# Patient Record
Sex: Female | Born: 1985 | Race: Black or African American | Hispanic: No | Marital: Single | Smoking: Current every day smoker
Health system: Southern US, Community
[De-identification: ages and names within clinical notes are randomized; demographics above are authoritative.]

## PROBLEM LIST (undated history)

## (undated) ENCOUNTER — Inpatient Hospital Stay (HOSPITAL_COMMUNITY): Payer: Self-pay

## (undated) HISTORY — PX: MANDIBLE FRACTURE SURGERY: SHX706

---
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
## 2006-10-16 ENCOUNTER — Emergency Department (HOSPITAL_COMMUNITY): Admission: EM | Admit: 2006-10-16 | Discharge: 2006-10-16 | Payer: Self-pay | Admitting: Emergency Medicine

## 2007-03-06 ENCOUNTER — Emergency Department (HOSPITAL_COMMUNITY): Admission: EM | Admit: 2007-03-06 | Discharge: 2007-03-06 | Payer: Self-pay | Admitting: *Deleted

## 2007-03-07 ENCOUNTER — Inpatient Hospital Stay (HOSPITAL_COMMUNITY): Admission: AD | Admit: 2007-03-07 | Discharge: 2007-03-07 | Payer: Self-pay | Admitting: Family Medicine

## 2007-06-18 ENCOUNTER — Inpatient Hospital Stay (HOSPITAL_COMMUNITY): Admission: AD | Admit: 2007-06-18 | Discharge: 2007-06-18 | Payer: Self-pay | Admitting: Obstetrics & Gynecology

## 2007-07-23 ENCOUNTER — Inpatient Hospital Stay (HOSPITAL_COMMUNITY): Admission: AD | Admit: 2007-07-23 | Discharge: 2007-07-23 | Payer: Self-pay | Admitting: Family Medicine

## 2008-02-25 ENCOUNTER — Ambulatory Visit: Payer: Self-pay | Admitting: Obstetrics & Gynecology

## 2008-02-25 ENCOUNTER — Inpatient Hospital Stay (HOSPITAL_COMMUNITY): Admission: AD | Admit: 2008-02-25 | Discharge: 2008-02-25 | Payer: Self-pay | Admitting: Obstetrics & Gynecology

## 2009-06-26 ENCOUNTER — Inpatient Hospital Stay (HOSPITAL_COMMUNITY): Admission: AD | Admit: 2009-06-26 | Discharge: 2009-06-26 | Payer: Self-pay | Admitting: Obstetrics & Gynecology

## 2009-08-15 ENCOUNTER — Inpatient Hospital Stay (HOSPITAL_COMMUNITY): Admission: AD | Admit: 2009-08-15 | Discharge: 2009-08-15 | Payer: Self-pay | Admitting: Family Medicine

## 2009-08-15 ENCOUNTER — Ambulatory Visit: Payer: Self-pay | Admitting: Gynecology

## 2009-08-15 ENCOUNTER — Ambulatory Visit: Payer: Self-pay | Admitting: Family Medicine

## 2010-04-28 ENCOUNTER — Inpatient Hospital Stay (HOSPITAL_COMMUNITY)
Admission: AD | Admit: 2010-04-28 | Discharge: 2010-05-01 | DRG: 775 | Disposition: A | Discharge status: Home / Self Care | Payer: Medicaid Other | Source: Ambulatory Visit | Attending: Obstetrics and Gynecology | Admitting: Obstetrics and Gynecology

## 2010-04-28 LAB — CBC
HCT: 36.6 % (ref 36.0–46.0)
Hemoglobin: 12.4 g/dL (ref 12.0–15.0)
MCH: 33 pg (ref 26.0–34.0)
MCHC: 33.9 g/dL (ref 30.0–36.0)
MCV: 97.3 fL (ref 78.0–100.0)
Platelets: 197 10*3/uL (ref 150–400)
RBC: 3.76 MIL/uL — ABNORMAL LOW (ref 3.87–5.11)
RDW: 12.9 % (ref 11.5–15.5)
WBC: 12.2 10*3/uL — ABNORMAL HIGH (ref 4.0–10.5)

## 2010-04-29 LAB — CBC
HCT: 35.9 % — ABNORMAL LOW (ref 36.0–46.0)
Hemoglobin: 12.3 g/dL (ref 12.0–15.0)
RDW: 12.9 % (ref 11.5–15.5)
WBC: 13.5 10*3/uL — ABNORMAL HIGH (ref 4.0–10.5)

## 2010-04-30 LAB — CBC
HCT: 34.4 % — ABNORMAL LOW (ref 36.0–46.0)
MCH: 32.8 pg (ref 26.0–34.0)
MCV: 98 fL (ref 78.0–100.0)
Platelets: 171 10*3/uL (ref 150–400)
RBC: 3.51 MIL/uL — ABNORMAL LOW (ref 3.87–5.11)
RDW: 13 % (ref 11.5–15.5)
WBC: 12.9 10*3/uL — ABNORMAL HIGH (ref 4.0–10.5)

## 2010-05-17 LAB — URINALYSIS, ROUTINE W REFLEX MICROSCOPIC
Nitrite: NEGATIVE
Protein, ur: NEGATIVE mg/dL
Specific Gravity, Urine: 1.02 (ref 1.005–1.030)

## 2010-05-17 LAB — CBC
HCT: 39.4 % (ref 36.0–46.0)
RBC: 4.07 MIL/uL (ref 3.87–5.11)

## 2010-05-17 LAB — GC/CHLAMYDIA PROBE AMP, GENITAL: GC Probe Amp, Genital: NEGATIVE

## 2010-05-19 LAB — URINALYSIS, ROUTINE W REFLEX MICROSCOPIC
Bilirubin Urine: NEGATIVE
Hgb urine dipstick: NEGATIVE
Protein, ur: NEGATIVE mg/dL
Specific Gravity, Urine: 1.02 (ref 1.005–1.030)
Urobilinogen, UA: 0.2 mg/dL (ref 0.0–1.0)
pH: 7.5 (ref 5.0–8.0)

## 2010-05-19 LAB — POCT PREGNANCY, URINE: Preg Test, Ur: NEGATIVE

## 2010-05-19 LAB — WET PREP, GENITAL: Yeast Wet Prep HPF POC: NONE SEEN

## 2010-07-17 NOTE — H&P (Signed)
NAME:  JAELANI, POSA NO.:  [id number]   MEDICAL RECORD NO.:  [id number]          PATIENT TYPE:  MAT   LOCATION:  [facility]                          FACILITY:  [facility]   PHYSICIAN:  Crist Fat. Rivard, M.D. DATE OF BIRTH:  October 08, 1985   DATE OF ADMISSION:  10/13/2004  DATE OF DISCHARGE:                                HISTORY & PHYSICAL   HISTORY OF PRESENT ILLNESS:  Miss Gwynn is an 25 year old, gravida 1, para  0, at 39-4/7 weeks who presented with spontaneous rupture of membranes  approximately 5:30 a.m. with clear fluid noted and uterine contractions  every two to four minutes since that time.  She has been 2 cm in the office.  Pregnancy has been remarkable for:  1.  Gonorrhea x1 and Chlamydia x2 during this pregnancy.  Negative cultures      were obtained during the third trimester.  2.  Positive group B strep.  3.  Adolescent.  4.  History of HSV1.   PRENATAL LABS:  Blood type is O positive.  RH antibody negative.  VDRL  nonreactive.  Rubella titer positive.  Hepatitis B surface antigen negative.  HIV nonreactive.  GC positive in January.  Chlamydia was negative in  January.  She had negative gonorrhea in February and positive Chlamydia.  In  March, she had negative GC and Chlamydia.  Then in May she had positive  Chlamydia and negative HIV, RPR, and HBSAG.  Cystic fibrosis testing was  negative.  Pap smear was normal except for yeast.  Quadruple screen was  normal.  Glucola was normal at 79.  Fetal fibronectin was done at 27 weeks  and was negative.  RPR, HBSAG and HIV were done at 28 weeks which were also  negative.  Group B strep culture was positive in July.  GC and Chlamydia  cultures were negative in the third trimester.  EDC of October 16, 2004 was  established by nine-week ultrasound secondary to questionable LMP.  She did  have history of ASCUS on Pap smear in May 2005 and had no followup.   HISTORY OF PRESENT PREGNANCY:  The patient has been under our care  for  approximately 12 weeks.  She had had an ultrasound at nine weeks for dating  purposes.  She had a test of cure done at her first visit for positive  gonorrhea.  This was still positive. This was positive for Chlamydia.  She  was then treated.  Test of cure done the following visit was negative.  She  had another ultrasound at 22 weeks for size greater than dates.  Normal  anatomy was noted and normal growth.  She had normal Glucola. She had some  spotting at 27 weeks.  Fetal fibronectin was negative. She did have positive  Chlamydia at that time.  Group B strep culture was negative at that point.  However, at 36 weeks it was positive.  She had a normal Glucola.  She had  removal of a small sebaceous cyst on the right buttock.  This drained  spontaneously.  She had a bump noted on her  left thigh between the buttocks  and left thigh consistent with a keloid versus a condylomata.  The rest of  her pregnancy was uncomplicated.   OB HISTORY:  The patient is a primigravida.   PAST MEDICAL HISTORY:  1.  History of gonorrhea noted in January prior to the initiation of her      care.  Subsequently she had Chlamydia noted twice during this pregnancy.  2.  She reports the usual childhood illnesses.   ALLERGIES:  She has no known medication allergies.   FAMILY HISTORY:  Her maternal grandmother had a stroke.  Her paternal  grandmother had some type of cancer and her mother is a smoker.  Genetic  history is remarkable for the patient's brother and sister being twins.   SOCIAL HISTORY:  The patient is single.  Father of the baby is not currently  involved.  The patient is in the 11th grade.  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  She denies any alcohol, drug or tobacco use during this  pregnancy.  She has been followed by the physicians' service.   PHYSICAL EXAMINATION:  VITAL SIGNS:  Blood pressure initially was 148/88,  145/88.  Repeat was now 149/79.  Other vital signs were  stable.  HEENT:  Within normal limits.  LUNGS:  Breath sounds are clear.  HEART:  Regular rate and rhythm without murmur.  BREASTS:  Soft and nontender.  ABDOMEN:  Fundal height approximately 39 cm.  Estimated fetal weight is 7 to  7-1/2 pounds.  Uterine contractions every two to four minutes.  Mild to  moderate quality.  SPECULUM EXAMINATION:  Sterile speculum exam shows positive pooling,  positive Nitrazine, positive fern.  Cervix was 4-5, 80%, vertex at -1 to 0  station.  The patient is noted to be leaking clear fluid.  Fetal heart rate  is reassuring with no decelerations although it is nonreactive.  She did  have a negative spontaneous CST on arrival.  EXTREMITIES:  Deep tendon reflexes are 2+ without clonus.  There is a trace  edema noted.   IMPRESSION:  1.  Intrauterine pregnancy at 39-4/7 weeks.  2.  Early active labor.  3.  Spontaneous rupture of membranes.  4.  Positive group B strep.   PLAN:  1.  Admit to birthing suite per consult with Dr. Silverio Lay as the      attending physician.  2.  Routine physician orders.  3.  Plan Penicillin G 5,000,000 units IV, then 2,500,000 units IV q.4h.  4.  Epidural p.r.n.      Renaldo Reel Emilee Hero, C.N.M.      Crist Fat Rivard, M.D.  Electronically Signed    VLL/MEDQ  D:  10/13/2004  T:  10/13/2004  Job:  [id number]

## 2010-11-19 LAB — POCT PREGNANCY, URINE
Operator id: 146091
Preg Test, Ur: NEGATIVE

## 2010-11-19 LAB — DIFFERENTIAL
Basophils Absolute: 0
Eosinophils Relative: 1
Lymphocytes Relative: 13
Lymphs Abs: 1.3
Monocytes Relative: 5
Neutro Abs: 8.5 — ABNORMAL HIGH

## 2010-11-19 LAB — CBC
Hemoglobin: 14
MCHC: 34.1
MCV: 94.5
Platelets: 233
RBC: 4.34
RDW: 12.6
WBC: 10.4

## 2010-11-19 LAB — URINALYSIS, ROUTINE W REFLEX MICROSCOPIC: Nitrite: NEGATIVE

## 2010-11-19 LAB — WET PREP, GENITAL: Yeast Wet Prep HPF POC: NONE SEEN

## 2010-11-19 LAB — RPR: RPR Ser Ql: NONREACTIVE

## 2010-11-19 LAB — URINE MICROSCOPIC-ADD ON

## 2010-11-24 LAB — POCT PREGNANCY, URINE
Operator id: 11741
Preg Test, Ur: POSITIVE

## 2010-11-24 LAB — URINALYSIS, ROUTINE W REFLEX MICROSCOPIC
Bilirubin Urine: NEGATIVE
Glucose, UA: NEGATIVE
Protein, ur: NEGATIVE
Specific Gravity, Urine: 1.015
Urobilinogen, UA: 0.2

## 2010-11-25 LAB — URINALYSIS, ROUTINE W REFLEX MICROSCOPIC
Bilirubin Urine: NEGATIVE
Ketones, ur: NEGATIVE
Nitrite: NEGATIVE
Urobilinogen, UA: 0.2

## 2010-11-25 LAB — URINE MICROSCOPIC-ADD ON

## 2010-12-04 LAB — WET PREP, GENITAL: Clue Cells Wet Prep HPF POC: NONE SEEN

## 2010-12-04 LAB — URINALYSIS, ROUTINE W REFLEX MICROSCOPIC
Glucose, UA: NEGATIVE mg/dL
Hgb urine dipstick: NEGATIVE
Specific Gravity, Urine: 1.02 (ref 1.005–1.030)
Urobilinogen, UA: 0.2 mg/dL (ref 0.0–1.0)

## 2010-12-04 LAB — GC/CHLAMYDIA PROBE AMP, GENITAL
Chlamydia, DNA Probe: NEGATIVE
GC Probe Amp, Genital: NEGATIVE

## 2011-04-23 ENCOUNTER — Emergency Department (HOSPITAL_COMMUNITY)
Admission: EM | Admit: 2011-04-23 | Discharge: 2011-04-23 | Disposition: A | Discharge status: Home / Self Care | Payer: Medicaid Other | Attending: Emergency Medicine | Admitting: Emergency Medicine

## 2011-04-23 ENCOUNTER — Emergency Department (HOSPITAL_COMMUNITY): Payer: Medicaid Other

## 2011-04-23 ENCOUNTER — Encounter (HOSPITAL_COMMUNITY): Payer: Self-pay | Admitting: *Deleted

## 2011-04-23 DIAGNOSIS — T7411XA Adult physical abuse, confirmed, initial encounter: Secondary | ICD-10-CM | POA: Insufficient documentation

## 2011-04-23 DIAGNOSIS — R259 Unspecified abnormal involuntary movements: Secondary | ICD-10-CM | POA: Insufficient documentation

## 2011-04-23 DIAGNOSIS — S02610A Fracture of condylar process of mandible, unspecified side, initial encounter for closed fracture: Secondary | ICD-10-CM | POA: Insufficient documentation

## 2011-04-23 DIAGNOSIS — T7491XA Unspecified adult maltreatment, confirmed, initial encounter: Secondary | ICD-10-CM | POA: Insufficient documentation

## 2011-04-23 DIAGNOSIS — T7492XA Unspecified child maltreatment, confirmed, initial encounter: Secondary | ICD-10-CM | POA: Insufficient documentation

## 2011-04-23 DIAGNOSIS — IMO0002 Reserved for concepts with insufficient information to code with codable children: Secondary | ICD-10-CM | POA: Insufficient documentation

## 2011-04-23 MED ORDER — AMOXICILLIN-POT CLAVULANATE 875-125 MG PO TABS: 1.0000 | ORAL_TABLET | Freq: Two times a day (BID) | ORAL | Status: AC

## 2011-04-23 MED ORDER — OXYCODONE-ACETAMINOPHEN 5-325 MG PO TABS
1.0000 | ORAL_TABLET | Freq: Once | ORAL | Status: AC
  Administered 2011-04-23: 1 via ORAL
  Filled 2011-04-23: qty 1

## 2011-04-23 MED ORDER — OXYCODONE-ACETAMINOPHEN 5-325 MG PO TABS: 1.0000 | ORAL_TABLET | ORAL | Status: AC | PRN

## 2011-04-23 MED ORDER — MORPHINE SULFATE 4 MG/ML IJ SOLN: 4.0000 mg | INTRAMUSCULAR | Status: DC
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
## 2011-04-23 NOTE — ED Notes (Signed)
Patient transported to X-ray 
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
## 2011-04-23 NOTE — ED Notes (Signed)
Patient transported to CT 
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
## 2011-04-23 NOTE — Discharge Instructions (Signed)
Your xrays showed that you have a broken jaw but did not show signs of other serious injury. We discussed your injury with Dr. Chales Salmon, who is an oral surgeon. Please call his office to set up an appointment as soon as possible. You will need to stick to a soft diet to avoid further injury. Please read the below information for recommended foods. You have been given a prescription for pain medication as well as an antibiotic for the bite on your finger. Return to the ER with worsening pain, trouble swallowing or breathing, or any other worrisome symptoms.  RESOURCE GUIDE  Dental Problems  Patients with Medicaid: [facility] [phone] [address]                                           [phone] [address] Phone:  [phone]                                                  Phone:  [phone]  If unable to pay or uninsured, contact:  [facility] or [facility]. to become qualified for the adult dental clinic.  Chronic Pain Problems Contact [facility]  [phone] Patients need to be referred by their primary care doctor.  Insufficient Money for Medicine Contact [employer] Way:  call "211" or [facility] 365-689-6504.  No Primary Care Doctor Call [facility]  [phone] Other agencies that provide inexpensive medical care    [facility]  [phone]    [facility]  [phone]    [facility]  [phone]    [facility]  [phone]    [facility]  [phone]    [facility]  [phone]  Psychological Services [facility]  [phone] [facility] Services  [phone] [facility]   [phone] (emergency services [phone])  Substance Abuse Resources [facility]  443-308-5698 [facility] [phone] [facility] [phone] Floydene Flock [phone] Residential & Outpatient Substance Abuse  Program  [phone]  Abuse/Neglect [facility] Child Abuse Hotline 204-408-9179 [facility] Child Abuse Hotline 770-370-3934 (After Hours)  Emergency Shelter [facility] [phone]  Maternity Homes [facility] [place] [facility] [phone] [facility] [phone]  MRSA Hotline #:   813-441-4802    [facility] Resources  [facility]     [employer]                          [facility] Dept. [address] [place]                       [address]      [address]  [address]  Cristobal Goldmann Phone:  [phone]                                   Phone:  [phone]                 Phone:  [phone]  [facility] Phone:  [phone]  [facility] Child Abuse Hotline (681) 489-5362 562-621-3846 (After Hours)  Fractured Jaw Diet This diet should be used after jaw or mouth surgery, wired jaw surgery, or dental surgery. The consistency of foods in this diet should be thin enough to be sipped from a straw or given through a syringe. It is important to consume enough calories and protein to prevent weight loss and to promote healing after surgery. You will need to have 3 meals and 3 snacks daily. It is important to eat from a variety of food groups. Foods you normally eat may be blended to the correct texture. INSTRUCTIONS FOR BLENDING FOODS  Prepare foods by removing skins, seeds, and peels.   Cook meats and vegetables thoroughly. Avoid using raw eggs. Powdered or pasteurized egg mixtures may be used.   Cut foods into small pieces and mix with a small amount of liquid in a food processor or blender. Continue to add liquids until foods become thin enough to sip through a straw.   Add juice, milk, cream, broth, gravy, or vegetable juice to add flavor and to thin foods.   Blending foods  sometimes causes air bubbles that may not be desirable. Heating foods after blending will reduce the foam produced from blending.  TIPS  If you are losing weight, you may need to add extra calories to your food by:   Adding powdered milk or protein powder to food.   Adding extra fats, such as tub margarine, sour cream, cream cheese, cream, nut butters (such as peanut butter or almond butter), and half-and-half.   Adding sweets, such as honey, ice cream, black strap molasses, or sugar.   Your teeth and mouth may be sensitive to extreme temperatures. Heat or cool your foods only to lukewarm or cool temperatures.   Stock Water quality scientist with a variety of liquid nutritional supplement drinks.   Take a liquid multivitamin to ensure you are getting adequate nutrients.   Use baby food if you are short on time or energy.  FOOD CHOICES ALL FOODS MUST BE BLENDED. Starches 4 servings or more  Hot cereals, such as oatmeal, grits, ground wheat cereals, and polenta.   Mashed potatoes.  Vegetables 3 servings or more  All vegetables and vegetable juices.  Fruit 2 servings or more  All fruits and fruit juices.  Meat and Meat Substitutes 2 servings or more (6 oz per day)  Soft-boiled eggs, scrambled eggs, cottage cheese, cheese sauce.   Ground meats, such as hamburger, [place], sausage, meatloaf.   Custard, baby food meat.  Milk 2 cups or equivalent  Liquid, powdered, or evaporated milk.   Buttermilk or chocolate milk.   Drinkable yogurt.   Sherbert, ice cream, milkshakes, eggnog, pudding, and liquid supplements.  Beverages  Coffee (regular or decaffeinated), tea, and mineral water.   Liquid supplements.  Condiments  All seasonings and condiments that blend well.  Document Released: 08/05/2009 Document Revised: 10/28/2010 Document Reviewed:  08/05/2009 Exit[employer] Patient Information 2012 [place], [place].Mandibular Fracture You have a fracture of your mandible. This fracture is a  break in the jaw bone. This is usually caused by trauma (a blow to the jaw). HOME CARE INSTRUCTIONS   If your jaws are wired, learn how to release them quickly in case of vomiting or severe coughing. Keep the wire cutters in an easy to reach location or carry them with you.   Apply ice to the injury for 15 to 20 minutes each hour while awake for the first 2 days. Put the ice in a plastic bag and place a towel between the bag of ice and your skin.   Eat a well balanced high-protein (full) liquid diet while your jaw is wired. Your caregiver or dietician can help you with this.   Protect your jaw from pressure. Sleep on your back.   Avoid exercising to the point that you become short of breath.   Only take over-the-counter or prescription medicines for pain, discomfort, or fever as directed by your caregiver.  SEEK MEDICAL CARE IF:   You develop a severe headache or numbness in your face.   You have severe jaw pain unrelieved with medications.   The wires or splints become loose.   You have uncontrollable nausea (feeling sick to your stomach) or anxiety.  SEEK IMMEDIATE MEDICAL CARE IF:  You have a fever.   You have difficulty breathing.   You feel like your airway is closing or hear a high-pitched sound when you try and breathe.   Problems have forced you to cut the wires holding your upper and lower teeth together.  MAKE SURE YOU:   Understand these instructions.   Will watch your condition.   Will get help right away if you are not doing well or get worse.  Document Released: 02/15/2005 Document Revised: 10/28/2010 Document Reviewed: 10/06/2007 [facility] Patient Information 2012 [place], [place].

## 2011-04-23 NOTE — ED Provider Notes (Signed)
Medical screening examination/treatment/procedure(s) were conducted as a shared visit with non-physician practitioner(s) and myself.  I personally evaluated the patient during the encounter  Melanny Wire P Jeanita Carneiro, MD 04/23/11 1629 

## 2012-04-28 ENCOUNTER — Emergency Department (HOSPITAL_COMMUNITY)
Admission: EM | Admit: 2012-04-28 | Discharge: 2012-04-29 | Disposition: A | Discharge status: Home / Self Care | Payer: Medicaid Other | Attending: Emergency Medicine | Admitting: Emergency Medicine

## 2012-04-28 ENCOUNTER — Encounter (HOSPITAL_COMMUNITY): Payer: Self-pay | Admitting: *Deleted

## 2012-04-28 DIAGNOSIS — S335XXA Sprain of ligaments of lumbar spine, initial encounter: Secondary | ICD-10-CM | POA: Insufficient documentation

## 2012-04-28 DIAGNOSIS — Y9389 Activity, other specified: Secondary | ICD-10-CM | POA: Insufficient documentation
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
## 2012-04-29 ENCOUNTER — Emergency Department (HOSPITAL_COMMUNITY): Payer: Medicaid Other

## 2012-04-29 MED ORDER — METHOCARBAMOL 500 MG PO TABS: 500.0000 mg | ORAL_TABLET | Freq: Two times a day (BID) | ORAL | Status: DC

## 2012-04-29 MED ORDER — IBUPROFEN 800 MG PO TABS
800.0000 mg | ORAL_TABLET | Freq: Once | ORAL | Status: AC
  Administered 2012-04-29: 800 mg via ORAL
  Filled 2012-04-29: qty 1

## 2012-04-29 MED ORDER — IBUPROFEN 800 MG PO TABS: 800.0000 mg | ORAL_TABLET | Freq: Three times a day (TID) | ORAL | Status: DC

## 2012-04-29 NOTE — ED Notes (Signed)
Pt child given grape juice.

## 2012-04-29 NOTE — ED Provider Notes (Signed)
Medical screening examination/treatment/procedure(s) were performed by non-physician practitioner and as supervising physician I was immediately available for consultation/collaboration.   Gavin Pound. Oletta Lamas, MD 04/29/12 [phone]
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
## 2012-04-29 NOTE — ED Notes (Signed)
Pt transported to XR.  

## 2012-12-09 ENCOUNTER — Encounter (HOSPITAL_COMMUNITY): Payer: Self-pay | Admitting: Emergency Medicine

## 2012-12-09 ENCOUNTER — Emergency Department (HOSPITAL_COMMUNITY): Payer: Medicaid Other

## 2012-12-09 ENCOUNTER — Emergency Department (HOSPITAL_COMMUNITY)
Admission: EM | Admit: 2012-12-09 | Discharge: 2012-12-09 | Disposition: A | Discharge status: Home / Self Care | Payer: Medicaid Other | Attending: Emergency Medicine | Admitting: Emergency Medicine

## 2012-12-09 DIAGNOSIS — S025XXA Fracture of tooth (traumatic), initial encounter for closed fracture: Secondary | ICD-10-CM | POA: Insufficient documentation

## 2012-12-09 DIAGNOSIS — S0993XA Unspecified injury of face, initial encounter: Secondary | ICD-10-CM

## 2012-12-09 DIAGNOSIS — Z9889 Other specified postprocedural states: Secondary | ICD-10-CM | POA: Insufficient documentation

## 2012-12-09 DIAGNOSIS — IMO0002 Reserved for concepts with insufficient information to code with codable children: Secondary | ICD-10-CM | POA: Insufficient documentation

## 2012-12-09 MED ORDER — NAPROXEN 500 MG PO TABS: 500.0000 mg | ORAL_TABLET | Freq: Two times a day (BID) | ORAL | Status: DC

## 2012-12-09 MED ORDER — TRAMADOL HCL 50 MG PO TABS: 50.0000 mg | ORAL_TABLET | Freq: Four times a day (QID) | ORAL | Status: DC | PRN

## 2012-12-09 NOTE — ED Notes (Signed)
Nurse asked if patient wanted to be private (XXX) said yes.

## 2012-12-09 NOTE — ED Notes (Signed)
Attempted to call patient boyfriend per request of patient no answer [phone]
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
## 2012-12-09 NOTE — ED Notes (Signed)
Gave coloring books to children that are at bedside.

## 2012-12-10 NOTE — ED Provider Notes (Signed)
Medical screening examination/treatment/procedure(s) were performed by non-physician practitioner and as supervising physician I was immediately available for consultation/collaboration.  Flint Melter, MD 12/10/12 0040

## 2013-02-15 LAB — OB RESULTS CONSOLE GC/CHLAMYDIA
CHLAMYDIA, DNA PROBE: NEGATIVE
GC PROBE AMP, GENITAL: NEGATIVE

## 2013-03-19 LAB — OB RESULTS CONSOLE RPR
RPR: NONREACTIVE
RPR: REACTIVE

## 2013-03-19 LAB — OB RESULTS CONSOLE HIV ANTIBODY (ROUTINE TESTING): HIV: NONREACTIVE

## 2013-03-19 LAB — OB RESULTS CONSOLE RUBELLA ANTIBODY, IGM: Rubella: IMMUNE

## 2013-03-19 LAB — OB RESULTS CONSOLE ANTIBODY SCREEN: Antibody Screen: NEGATIVE

## 2013-03-19 LAB — OB RESULTS CONSOLE ABO/RH: RH TYPE: POSITIVE

## 2013-03-19 LAB — OB RESULTS CONSOLE HEPATITIS B SURFACE ANTIGEN: Hepatitis B Surface Ag: NEGATIVE

## 2013-08-30 LAB — OB RESULTS CONSOLE GBS: GBS: POSITIVE

## 2013-10-01 ENCOUNTER — Inpatient Hospital Stay (HOSPITAL_COMMUNITY)
Admission: AD | Admit: 2013-10-01 | Discharge: 2013-10-04 | DRG: 775 | Disposition: A | Discharge status: Home / Self Care | Payer: Medicaid Other | Source: Ambulatory Visit | Attending: Obstetrics and Gynecology | Admitting: Obstetrics and Gynecology

## 2013-10-01 ENCOUNTER — Encounter (HOSPITAL_COMMUNITY): Payer: Self-pay | Admitting: *Deleted

## 2013-10-01 ENCOUNTER — Inpatient Hospital Stay (HOSPITAL_COMMUNITY): Payer: Medicaid Other

## 2013-10-01 DIAGNOSIS — O480 Post-term pregnancy: Principal | ICD-10-CM | POA: Diagnosis present

## 2013-10-01 DIAGNOSIS — D649 Anemia, unspecified: Secondary | ICD-10-CM | POA: Diagnosis present

## 2013-10-01 DIAGNOSIS — O9902 Anemia complicating childbirth: Secondary | ICD-10-CM | POA: Diagnosis present

## 2013-10-01 DIAGNOSIS — O99334 Smoking (tobacco) complicating childbirth: Secondary | ICD-10-CM | POA: Diagnosis present

## 2013-10-01 LAB — URINALYSIS, ROUTINE W REFLEX MICROSCOPIC
Bilirubin Urine: NEGATIVE
Glucose, UA: NEGATIVE mg/dL
KETONES UR: NEGATIVE mg/dL
NITRITE: NEGATIVE
PROTEIN: NEGATIVE mg/dL
Specific Gravity, Urine: 1.015 (ref 1.005–1.030)
Urobilinogen, UA: 1 mg/dL (ref 0.0–1.0)
pH: 7 (ref 5.0–8.0)

## 2013-10-01 LAB — URINE MICROSCOPIC-ADD ON

## 2013-10-01 LAB — CBC
HCT: 30.9 % — ABNORMAL LOW (ref 36.0–46.0)
Hemoglobin: 10.6 g/dL — ABNORMAL LOW (ref 12.0–15.0)
MCH: 33.2 pg (ref 26.0–34.0)
MCHC: 34.3 g/dL (ref 30.0–36.0)
MCV: 96.9 fL (ref 78.0–100.0)
Platelets: 235 10*3/uL (ref 150–400)
RBC: 3.19 MIL/uL — ABNORMAL LOW (ref 3.87–5.11)
RDW: 13.4 % (ref 11.5–15.5)
WBC: 12.4 10*3/uL — ABNORMAL HIGH (ref 4.0–10.5)

## 2013-10-01 MED ORDER — OXYTOCIN 40 UNITS IN LACTATED RINGERS INFUSION - SIMPLE MED
62.5000 mL/h | INTRAVENOUS | Status: DC
  Filled 2013-10-01 (×2): qty 1000

## 2013-10-01 MED ORDER — LACTATED RINGERS IV SOLN
INTRAVENOUS | Status: DC
  Administered 2013-10-01: 18:00:00 via INTRAVENOUS

## 2013-10-01 MED ORDER — PENICILLIN G POTASSIUM 5000000 UNITS IJ SOLR
5.0000 10*6.[IU] | Freq: Once | INTRAVENOUS | Status: AC
  Administered 2013-10-01: 5 10*6.[IU] via INTRAVENOUS
  Filled 2013-10-01: qty 5

## 2013-10-01 MED ORDER — PENICILLIN G POTASSIUM 5000000 UNITS IJ SOLR
2.5000 10*6.[IU] | INTRAMUSCULAR | Status: DC
  Administered 2013-10-02 (×2): 2.5 10*6.[IU] via INTRAVENOUS
  Filled 2013-10-01 (×8): qty 2.5

## 2013-10-01 MED ORDER — TERBUTALINE SULFATE 1 MG/ML IJ SOLN: 0.2500 mg | Freq: Once | INTRAMUSCULAR | Status: AC | PRN

## 2013-10-01 MED ORDER — LIDOCAINE HCL (PF) 1 % IJ SOLN
30.0000 mL | INTRAMUSCULAR | Status: AC | PRN
  Administered 2013-10-02 (×2): 5 mL via SUBCUTANEOUS
  Filled 2013-10-01: qty 30

## 2013-10-01 MED ORDER — ACETAMINOPHEN 325 MG PO TABS: 650.0000 mg | ORAL_TABLET | ORAL | Status: DC | PRN

## 2013-10-01 MED ORDER — BUTORPHANOL TARTRATE 1 MG/ML IJ SOLN
1.0000 mg | INTRAMUSCULAR | Status: DC | PRN
  Administered 2013-10-02 (×2): 1 mg via INTRAVENOUS
  Filled 2013-10-01 (×2): qty 1

## 2013-10-01 MED ORDER — ONDANSETRON HCL 4 MG/2ML IJ SOLN: 4.0000 mg | Freq: Four times a day (QID) | INTRAMUSCULAR | Status: DC | PRN

## 2013-10-01 MED ORDER — OXYTOCIN BOLUS FROM INFUSION
500.0000 mL | INTRAVENOUS | Status: DC
  Administered 2013-10-02: 500 mL via INTRAVENOUS

## 2013-10-01 MED ORDER — IBUPROFEN 600 MG PO TABS
600.0000 mg | ORAL_TABLET | Freq: Four times a day (QID) | ORAL | Status: DC | PRN
  Administered 2013-10-02 – 2013-10-04 (×2): 600 mg via ORAL
  Filled 2013-10-01: qty 1

## 2013-10-01 MED ORDER — FLEET ENEMA 7-19 GM/118ML RE ENEM: 1.0000 | ENEMA | RECTAL | Status: DC | PRN

## 2013-10-01 MED ORDER — OXYCODONE-ACETAMINOPHEN 5-325 MG PO TABS
1.0000 | ORAL_TABLET | ORAL | Status: DC | PRN
  Administered 2013-10-03 (×2): 1 via ORAL
  Filled 2013-10-01: qty 1
  Filled 2013-10-01: qty 2
  Filled 2013-10-01 (×4): qty 1

## 2013-10-01 MED ORDER — LACTATED RINGERS IV SOLN: 500.0000 mL | INTRAVENOUS | Status: DC | PRN

## 2013-10-01 MED ORDER — CITRIC ACID-SODIUM CITRATE 334-500 MG/5ML PO SOLN
30.0000 mL | ORAL | Status: DC | PRN
Start: 2013-10-01 — End: 2013-10-02

## 2013-10-01 MED ORDER — MISOPROSTOL 25 MCG QUARTER TABLET
25.0000 ug | ORAL_TABLET | ORAL | Status: DC | PRN
  Administered 2013-10-01: 25 ug via VAGINAL
  Filled 2013-10-01: qty 0.25
  Filled 2013-10-01: qty 1

## 2013-10-01 NOTE — H&P (Signed)
Pt is a 28 y/o black female [id number] who is admitted for an induction for post term preg with a BPP today of 6/8. PNC was uncomplicated. PMHX: see hollister PE: VSSAF         HEENT-wnl         ABD- gravid, non tender         Cx- 50/1/-2 vtx IMP/ IUP at 40+ wks Plan./Admit for induction.

## 2013-10-01 NOTE — MAU Provider Note (Signed)
  History     CSN: [id number]  Arrival date and time: 10/01/13 1124   First Provider Initiated Contact with Patient 10/01/13 1202      Chief Complaint  Patient presents with  . elevated blood pressure   . Contractions   HPI Comments: [name] 28 y.o. [id number] presents to MAU with elevated BPs at home and contractions. She called the office and was told to come to MAU. She was 1 cm in office last week. She is asking to be induced because the [name] is in jail and she has a lot to get done.      History reviewed. No pertinent past medical history.  Past Surgical History  Procedure Laterality Date  . Mandible fracture surgery      History reviewed. No pertinent family history.  History  Substance Use Topics  . Smoking status: Current Every Day Smoker  . Smokeless tobacco: Never Used  . Alcohol Use: No    Allergies: No Known Allergies  Prescriptions prior to admission  Medication Sig Dispense Refill  . diphenhydramine-acetaminophen (TYLENOL PM) 25-500 MG TABS Take 1 tablet by mouth at bedtime as needed.      . Prenatal Vit-Fe Fumarate-FA (PRENATAL MULTIVITAMIN) TABS tablet Take 1 tablet by mouth daily at 12 noon.        Review of Systems  Constitutional: Negative.   HENT: Negative.   Eyes: Negative.   Respiratory: Negative.   Cardiovascular: Negative.   Gastrointestinal: Negative.   Genitourinary:       Contractions  Musculoskeletal: Negative.   Skin: Negative.   Neurological: Negative.   Psychiatric/Behavioral: Negative.    Physical Exam   Blood pressure 121/63, pulse 88, temperature 98.2 F (36.8 C), temperature source Oral, resp. rate 18, height 5' 3.5" (1.613 m), weight 88.814 kg (195 lb 12.8 oz), last menstrual period 12/07/2012, SpO2 99.00%.  Physical Exam  Constitutional: She is oriented to person, place, and time. She appears well-developed and well-nourished. No distress.  HENT:  Head: Normocephalic and atraumatic.  Eyes: Pupils are equal,  round, and reactive to light.  Cardiovascular: Normal rate, regular rhythm and normal heart sounds.   Respiratory: Effort normal and breath sounds normal.  Genitourinary:  Cervix 1 cm thick  Musculoskeletal: Normal range of motion.  Neurological: She is alert and oriented to person, place, and time.  Skin: Skin is warm.  Psychiatric: She has a normal mood and affect. Her behavior is normal. Judgment and thought content normal.   TOCO: fetal heart rate 130's, reactive but not reassuring, rare contraction MAU Course  Procedures  MDM   Called Dr Dareen PianoAnderson who advised BPP and he would come over and look at strips BPP 6/8 not breathing  Assessment and Plan   A: Labor P: Admit per Dr Bonner PunaAnderson  [name], [name] 10/01/2013, 12:32 PM

## 2013-10-01 NOTE — Progress Notes (Signed)
Dr. Dareen PianoAnderson in department and reviewed EFM tracing. States patient can be discharged pending BPP results. Ordered for patient to be scheduled for induction on Wednesday am; patient placed on induction schedule for Wednesday 10/03/2013 at 6:30 am.

## 2013-10-01 NOTE — Progress Notes (Signed)
Induction orders given: 25mcg cytotec q4h, PCN for +GBS treatment, stadol 1mg  q1h PRN, and pt may have epidural upon maternal request.

## 2013-10-01 NOTE — MAU Note (Signed)
Patient presents to MAU for evaluation of elevated blood pressure; states took BP at home and was 160's over 100's. Called OB office and they advised patient to come to MAU. Reports occassional contractions since yesterday; Last VE in office last week was 1cm. Denies LOF or VB at this time. +FM. States she is very stressed with [name] in jail and desires induction.

## 2013-10-02 ENCOUNTER — Encounter (HOSPITAL_COMMUNITY): Payer: Medicaid Other | Admitting: Anesthesiology

## 2013-10-02 ENCOUNTER — Encounter (HOSPITAL_COMMUNITY): Payer: Self-pay | Admitting: *Deleted

## 2013-10-02 ENCOUNTER — Inpatient Hospital Stay (HOSPITAL_COMMUNITY): Payer: Medicaid Other | Admitting: Anesthesiology

## 2013-10-02 LAB — RPR

## 2013-10-02 MED ORDER — PHENYLEPHRINE 40 MCG/ML (10ML) SYRINGE FOR IV PUSH (FOR BLOOD PRESSURE SUPPORT)
80.0000 ug | PREFILLED_SYRINGE | INTRAVENOUS | Status: DC | PRN
  Filled 2013-10-02: qty 2
  Filled 2013-10-02: qty 10

## 2013-10-02 MED ORDER — PHENYLEPHRINE 40 MCG/ML (10ML) SYRINGE FOR IV PUSH (FOR BLOOD PRESSURE SUPPORT)
80.0000 ug | PREFILLED_SYRINGE | INTRAVENOUS | Status: DC | PRN
  Filled 2013-10-02: qty 2

## 2013-10-02 MED ORDER — METHYLERGONOVINE MALEATE 0.2 MG/ML IJ SOLN: 0.2000 mg | INTRAMUSCULAR | Status: DC | PRN

## 2013-10-02 MED ORDER — ONDANSETRON HCL 4 MG/2ML IJ SOLN: 4.0000 mg | INTRAMUSCULAR | Status: DC | PRN

## 2013-10-02 MED ORDER — LANOLIN HYDROUS EX OINT: TOPICAL_OINTMENT | CUTANEOUS | Status: DC | PRN

## 2013-10-02 MED ORDER — TERBUTALINE SULFATE 1 MG/ML IJ SOLN: 0.2500 mg | Freq: Once | INTRAMUSCULAR | Status: DC | PRN

## 2013-10-02 MED ORDER — PRENATAL MULTIVITAMIN CH
1.0000 | ORAL_TABLET | Freq: Every day | ORAL | Status: DC
  Administered 2013-10-02 – 2013-10-04 (×3): 1 via ORAL
  Filled 2013-10-02 (×3): qty 1

## 2013-10-02 MED ORDER — DIPHENHYDRAMINE HCL 50 MG/ML IJ SOLN: 12.5000 mg | INTRAMUSCULAR | Status: DC | PRN

## 2013-10-02 MED ORDER — EPHEDRINE 5 MG/ML INJ
10.0000 mg | INTRAVENOUS | Status: DC | PRN
Start: 2013-10-02 — End: 2013-10-02
  Filled 2013-10-02: qty 2

## 2013-10-02 MED ORDER — DIBUCAINE 1 % RE OINT: 1.0000 "application " | TOPICAL_OINTMENT | RECTAL | Status: DC | PRN

## 2013-10-02 MED ORDER — SENNOSIDES-DOCUSATE SODIUM 8.6-50 MG PO TABS
2.0000 | ORAL_TABLET | ORAL | Status: DC
  Administered 2013-10-03 – 2013-10-04 (×2): 2 via ORAL
  Filled 2013-10-02 (×2): qty 2

## 2013-10-02 MED ORDER — TETANUS-DIPHTH-ACELL PERTUSSIS 5-2.5-18.5 LF-MCG/0.5 IM SUSP
0.5000 mL | Freq: Once | INTRAMUSCULAR | Status: AC
  Administered 2013-10-03: 0.5 mL via INTRAMUSCULAR

## 2013-10-02 MED ORDER — DIPHENHYDRAMINE HCL 25 MG PO CAPS
25.0000 mg | ORAL_CAPSULE | Freq: Four times a day (QID) | ORAL | Status: DC | PRN
Start: 2013-10-02 — End: 2013-10-02

## 2013-10-02 MED ORDER — OXYCODONE-ACETAMINOPHEN 5-325 MG PO TABS
1.0000 | ORAL_TABLET | ORAL | Status: DC | PRN
  Administered 2013-10-02: 2 via ORAL
  Administered 2013-10-02 – 2013-10-04 (×4): 1 via ORAL
  Filled 2013-10-02: qty 1
  Filled 2013-10-02: qty 2

## 2013-10-02 MED ORDER — LACTATED RINGERS IV SOLN
500.0000 mL | Freq: Once | INTRAVENOUS | Status: AC
  Administered 2013-10-02: 500 mL via INTRAVENOUS

## 2013-10-02 MED ORDER — PNEUMOCOCCAL VAC POLYVALENT 25 MCG/0.5ML IJ INJ
0.5000 mL | INJECTION | INTRAMUSCULAR | Status: AC
  Administered 2013-10-03: 0.5 mL via INTRAMUSCULAR
  Filled 2013-10-02 (×2): qty 0.5

## 2013-10-02 MED ORDER — ONDANSETRON HCL 4 MG PO TABS: 4.0000 mg | ORAL_TABLET | ORAL | Status: DC | PRN

## 2013-10-02 MED ORDER — OXYTOCIN 40 UNITS IN LACTATED RINGERS INFUSION - SIMPLE MED
1.0000 m[IU]/min | INTRAVENOUS | Status: DC
  Administered 2013-10-02: 2 m[IU]/min via INTRAVENOUS

## 2013-10-02 MED ORDER — ZOLPIDEM TARTRATE 5 MG PO TABS: 5.0000 mg | ORAL_TABLET | Freq: Every evening | ORAL | Status: DC | PRN

## 2013-10-02 MED ORDER — EPHEDRINE 5 MG/ML INJ
10.0000 mg | INTRAVENOUS | Status: DC | PRN
  Filled 2013-10-02: qty 2

## 2013-10-02 MED ORDER — SIMETHICONE 80 MG PO CHEW: 80.0000 mg | CHEWABLE_TABLET | ORAL | Status: DC | PRN

## 2013-10-02 MED ORDER — METHYLERGONOVINE MALEATE 0.2 MG PO TABS: 0.2000 mg | ORAL_TABLET | ORAL | Status: DC | PRN

## 2013-10-02 MED ORDER — BENZOCAINE-MENTHOL 20-0.5 % EX AERO
1.0000 "application " | INHALATION_SPRAY | CUTANEOUS | Status: DC | PRN
  Administered 2013-10-02: 1 via TOPICAL
  Filled 2013-10-02: qty 56

## 2013-10-02 MED ORDER — FENTANYL 2.5 MCG/ML BUPIVACAINE 1/10 % EPIDURAL INFUSION (WH - ANES)
14.0000 mL/h | INTRAMUSCULAR | Status: DC | PRN
  Administered 2013-10-02: 14 mL/h via EPIDURAL
  Filled 2013-10-02: qty 125

## 2013-10-02 MED ORDER — WITCH HAZEL-GLYCERIN EX PADS: 1.0000 "application " | MEDICATED_PAD | CUTANEOUS | Status: DC | PRN

## 2013-10-02 MED ORDER — IBUPROFEN 600 MG PO TABS
600.0000 mg | ORAL_TABLET | Freq: Four times a day (QID) | ORAL | Status: DC
  Administered 2013-10-02 – 2013-10-04 (×7): 600 mg via ORAL
  Filled 2013-10-02 (×8): qty 1

## 2013-10-02 NOTE — Anesthesia Preprocedure Evaluation (Signed)
Anesthesia Evaluation  Patient identified by MRN, date of birth, ID band Patient awake    Reviewed: Allergy & Precautions, H&P , NPO status , Patient's Chart, lab work & pertinent test results  History of Anesthesia Complications Negative for: history of anesthetic complications  Airway Mallampati: II TM Distance: >3 FB Neck ROM: Full    Dental   Pulmonary neg shortness of breath, neg sleep apnea, neg COPDneg recent URI, Current Smoker,          Cardiovascular negative cardio ROS  Rhythm:Regular     Neuro/Psych negative neurological ROS  negative psych ROS   GI/Hepatic negative GI ROS, Neg liver ROS,   Endo/Other  negative endocrine ROS  Renal/GU negative Renal ROS     Musculoskeletal   Abdominal   Peds  Hematology  (+) anemia ,   Anesthesia Other Findings   Reproductive/Obstetrics                           Anesthesia Physical Anesthesia Plan  ASA: II  Anesthesia Plan: Epidural   Post-op Pain Management:    Induction:   Airway Management Planned:   Additional Equipment:   Intra-op Plan:   Post-operative Plan:   Informed Consent: I have reviewed the patients History and Physical, chart, labs and discussed the procedure including the risks, benefits and alternatives for the proposed anesthesia with the patient or authorized representative who has indicated his/her understanding and acceptance.   Dental advisory given  Plan Discussed with: Anesthesiologist  Anesthesia Plan Comments:         Anesthesia Quick Evaluation

## 2013-10-02 NOTE — Lactation Note (Signed)
This note was copied from the chart of [name]. Lactation Consultation Note  Patient Name: Hayley Pablo Lawrencelona Liskey [id number]'s Date: 10/02/2013 Reason for consult: Other (Comment) (charting for exclusion)   Maternal Data Formula Feeding for Exclusion: Yes Reason for exclusion: Mother's choice to formula feed on admision  Feeding    LATCH Score/Interventions                      Lactation Tools Discussed/Used     Consult Status Consult Status: Complete    [name], [name] 10/02/2013, 4:15 PM

## 2013-10-02 NOTE — Anesthesia Postprocedure Evaluation (Signed)
Anesthesia Post Note  Patient: [name]  Procedure(s) Performed: * No procedures listed *  Anesthesia type: Epidural  Patient location: Mother/Baby  Post pain: Pain level controlled  Post assessment: Post-op Vital signs reviewed  Last Vitals:  Filed Vitals:   10/02/13 1344  BP: 127/66  Pulse: 55  Temp: 36.8 C  Resp: 18    Post vital signs: Reviewed  Level of consciousness:alert  Complications: No apparent anesthesia complications

## 2013-10-02 NOTE — Progress Notes (Signed)
UR chart review completed.  

## 2013-10-02 NOTE — Anesthesia Procedure Notes (Signed)
Epidural Patient location during procedure: OB Start time: 10/02/2013 4:39 AM End time: 10/02/2013 4:51 AM  Staffing Anesthesiologist: Nabiha Planck, CHRIS Performed by: anesthesiologist   Preanesthetic Checklist Completed: patient identified, surgical consent, pre-op evaluation, timeout performed, IV checked, risks and benefits discussed and monitors and equipment checked  Epidural Patient position: sitting Prep: site prepped and draped and DuraPrep Patient monitoring: heart rate, cardiac monitor, continuous pulse ox and blood pressure Approach: midline Location: L3-L4 Injection technique: LOR saline  Needle:  Needle type: Tuohy  Needle gauge: 17 G Needle length: 9 cm Needle insertion depth: 6 cm Catheter type: closed end flexible Catheter size: 19 Gauge Catheter at skin depth: 12 cm Test dose: Other  Assessment Events: blood not aspirated, injection not painful, no injection resistance, negative IV test and no paresthesia  Additional Notes H+P and labs checked, risks and benefits discussed with the patient, consent obtained, procedure tolerated well and without complications.  Reason for block:procedure for pain

## 2013-10-03 ENCOUNTER — Inpatient Hospital Stay (HOSPITAL_COMMUNITY): Admit: 2013-10-03 | Payer: Medicaid Other

## 2013-10-03 LAB — CBC
HEMATOCRIT: 29.4 % — AB (ref 36.0–46.0)
HEMOGLOBIN: 9.9 g/dL — AB (ref 12.0–15.0)
MCH: 32.7 pg (ref 26.0–34.0)
MCHC: 33.7 g/dL (ref 30.0–36.0)
MCV: 97 fL (ref 78.0–100.0)
Platelets: 213 10*3/uL (ref 150–400)
RBC: 3.03 MIL/uL — ABNORMAL LOW (ref 3.87–5.11)
RDW: 13.4 % (ref 11.5–15.5)
WBC: 13.8 10*3/uL — AB (ref 4.0–10.5)

## 2013-10-03 NOTE — Progress Notes (Signed)
PPD#1 Pt and baby doing well IMP/ stable PLAN/ Routine care.

## 2013-10-04 MED ORDER — OXYCODONE-ACETAMINOPHEN 5-325 MG PO TABS: 1.0000 | ORAL_TABLET | ORAL | Status: DC | PRN

## 2013-10-04 NOTE — Discharge Summary (Signed)
Obstetric Discharge Summary Reason for Admission: induction of labor Prenatal Procedures: ultrasound Intrapartum Procedures: spontaneous vaginal delivery Postpartum Procedures: none Complications-Operative and Postpartum: none Hemoglobin  Date Value Ref Range Status  10/03/2013 9.9* 12.0 - 15.0 g/dL Final     HCT  Date Value Ref Range Status  10/03/2013 29.4* 36.0 - 46.0 % Final    Physical Exam:  General: alert Lochia: appropriate Uterine Fundus: firm   Discharge Diagnoses: Term Pregnancy-delivered  Discharge Information: Date: 10/04/2013 Activity: pelvic rest Diet: routine Medications: PNV, Ibuprofen and Percocet Condition: stable Instructions: refer to practice specific booklet Discharge to: home Follow-up Information   Follow up with [name],Hayley Deskins Burton, [name]. Schedule an appointment as soon as possible for a visit in 1 month.   Specialty:  Obstetrics and Gynecology   Contact information:   [address] [place] [id number] [phone]       Newborn Data: Live born female  Birth Weight: 8 lb 2 oz (3685 g) APGAR: 9, 9  Home with mother.  [name] 10/04/2013, 8:56 AM

## 2013-10-04 NOTE — Progress Notes (Signed)
PPD#2 Pt and baby doing well. IMP/ stable Plan/ will discharge.

## 2013-12-31 ENCOUNTER — Encounter (HOSPITAL_COMMUNITY): Payer: Self-pay | Admitting: *Deleted

## 2014-08-05 ENCOUNTER — Inpatient Hospital Stay (HOSPITAL_COMMUNITY): Payer: Medicaid Other

## 2014-08-05 ENCOUNTER — Inpatient Hospital Stay (HOSPITAL_COMMUNITY)
Admission: AD | Admit: 2014-08-05 | Discharge: 2014-08-05 | Disposition: A | Discharge status: Home / Self Care | Payer: Medicaid Other | Source: Ambulatory Visit | Attending: Emergency Medicine | Admitting: Emergency Medicine

## 2014-08-05 ENCOUNTER — Encounter (HOSPITAL_COMMUNITY): Payer: Self-pay | Admitting: *Deleted

## 2014-08-05 DIAGNOSIS — Z79899 Other long term (current) drug therapy: Secondary | ICD-10-CM | POA: Insufficient documentation

## 2014-08-05 DIAGNOSIS — Z720 Tobacco use: Secondary | ICD-10-CM | POA: Diagnosis not present

## 2014-08-05 DIAGNOSIS — R079 Chest pain, unspecified: Secondary | ICD-10-CM | POA: Diagnosis present

## 2014-08-05 DIAGNOSIS — R059 Cough, unspecified: Secondary | ICD-10-CM

## 2014-08-05 DIAGNOSIS — R110 Nausea: Secondary | ICD-10-CM | POA: Diagnosis not present

## 2014-08-05 DIAGNOSIS — R0602 Shortness of breath: Secondary | ICD-10-CM | POA: Diagnosis not present

## 2014-08-05 DIAGNOSIS — M546 Pain in thoracic spine: Secondary | ICD-10-CM | POA: Diagnosis not present

## 2014-08-05 DIAGNOSIS — Z8781 Personal history of (healed) traumatic fracture: Secondary | ICD-10-CM | POA: Insufficient documentation

## 2014-08-05 DIAGNOSIS — Z3202 Encounter for pregnancy test, result negative: Secondary | ICD-10-CM | POA: Insufficient documentation

## 2014-08-05 DIAGNOSIS — R05 Cough: Secondary | ICD-10-CM | POA: Insufficient documentation

## 2014-08-05 DIAGNOSIS — R0789 Other chest pain: Secondary | ICD-10-CM | POA: Diagnosis not present

## 2014-08-05 LAB — BASIC METABOLIC PANEL
Anion gap: 10 (ref 5–15)
BUN: 9 mg/dL (ref 6–20)
CO2: 21 mmol/L — ABNORMAL LOW (ref 22–32)
Calcium: 9.2 mg/dL (ref 8.9–10.3)
Chloride: 107 mmol/L (ref 101–111)
Creatinine, Ser: 0.85 mg/dL (ref 0.44–1.00)
GFR calc Af Amer: >60 mL/min (ref >60)
GFR calc non Af Amer: >60 mL/min (ref >60)
Glucose, Bld: 144 mg/dL — ABNORMAL HIGH (ref 65–99)
Potassium: 3.1 mmol/L — ABNORMAL LOW (ref 3.5–5.1)
SODIUM: 138 mmol/L (ref 135–145)

## 2014-08-05 LAB — URINALYSIS, ROUTINE W REFLEX MICROSCOPIC
BILIRUBIN URINE: NEGATIVE
GLUCOSE, UA: NEGATIVE mg/dL
KETONES UR: NEGATIVE mg/dL
NITRITE: NEGATIVE
PH: 6 (ref 5.0–8.0)
Protein, ur: NEGATIVE mg/dL
UROBILINOGEN UA: 0.2 mg/dL (ref 0.0–1.0)

## 2014-08-05 LAB — CBC WITH DIFFERENTIAL/PLATELET
Basophils Absolute: 0 10*3/uL (ref 0.0–0.1)
Basophils Relative: 0 % (ref 0–1)
EOS PCT: 2 % (ref 0–5)
Eosinophils Absolute: 0.1 10*3/uL (ref 0.0–0.7)
HEMATOCRIT: 38.7 % (ref 36.0–46.0)
Hemoglobin: 13 g/dL (ref 12.0–15.0)
LYMPHS PCT: 45 % (ref 12–46)
Lymphs Abs: 3.4 10*3/uL (ref 0.7–4.0)
MCH: 30.4 pg (ref 26.0–34.0)
MCHC: 33.6 g/dL (ref 30.0–36.0)
MCV: 90.4 fL (ref 78.0–100.0)
Monocytes Absolute: 0.4 10*3/uL (ref 0.1–1.0)
Monocytes Relative: 5 % (ref 3–12)
Neutro Abs: 3.6 10*3/uL (ref 1.7–7.7)
Neutrophils Relative %: 48 % (ref 43–77)
PLATELETS: 240 10*3/uL (ref 150–400)
RBC: 4.28 MIL/uL (ref 3.87–5.11)
RDW: 12.8 % (ref 11.5–15.5)
WBC: 7.6 10*3/uL (ref 4.0–10.5)

## 2014-08-05 LAB — URINE MICROSCOPIC-ADD ON

## 2014-08-05 LAB — I-STAT TROPONIN, ED: TROPONIN I, POC: 0 ng/mL (ref 0.00–0.08)

## 2014-08-05 LAB — POCT PREGNANCY, URINE: Preg Test, Ur: NEGATIVE

## 2014-08-05 MED ORDER — ALBUTEROL SULFATE HFA 108 (90 BASE) MCG/ACT IN AERS
1.0000 | INHALATION_SPRAY | Freq: Four times a day (QID) | RESPIRATORY_TRACT | Status: DC | PRN
  Administered 2014-08-05: 2 via RESPIRATORY_TRACT
  Filled 2014-08-05: qty 6.7

## 2014-08-05 MED ORDER — CYCLOBENZAPRINE HCL 10 MG PO TABS: 10.0000 mg | ORAL_TABLET | Freq: Two times a day (BID) | ORAL | Status: DC | PRN

## 2014-08-05 MED ORDER — POTASSIUM CHLORIDE CRYS ER 20 MEQ PO TBCR
40.0000 meq | EXTENDED_RELEASE_TABLET | Freq: Once | ORAL | Status: AC
  Administered 2014-08-05: 40 meq via ORAL
  Filled 2014-08-05: qty 2

## 2014-08-05 MED ORDER — HYDROCODONE-ACETAMINOPHEN 5-325 MG PO TABS
2.0000 | ORAL_TABLET | Freq: Once | ORAL | Status: AC
  Administered 2014-08-05: 2 via ORAL
  Filled 2014-08-05: qty 2

## 2014-08-05 MED ORDER — BENZONATATE 100 MG PO CAPS: 100.0000 mg | ORAL_CAPSULE | Freq: Three times a day (TID) | ORAL | Status: DC

## 2014-08-05 MED ORDER — IBUPROFEN 600 MG PO TABS: 600.0000 mg | ORAL_TABLET | Freq: Four times a day (QID) | ORAL | Status: DC | PRN

## 2014-08-05 MED ORDER — IBUPROFEN 800 MG PO TABS
800.0000 mg | ORAL_TABLET | Freq: Once | ORAL | Status: AC
  Administered 2014-08-05: 800 mg via ORAL
  Filled 2014-08-05: qty 1

## 2014-08-05 NOTE — MAU Note (Signed)
Pt transported to [facility] by [facility].

## 2014-08-05 NOTE — MAU Provider Note (Signed)
History     CSN: [id number]  Arrival date and time: 08/05/14 1523   None     Chief Complaint  Patient presents with  . Chest Pain   HPI [name] is 29 y.o. [id number] presents with complaint of left chest pain and shortness of breath that caused to leave work today at 8am.  Began while walking up steps in her mid upper back.  She did not take anything for the pain but drank some water and ate breakfast hoping that would help.  She laid down, hard to get comfortable and was able to sleep until 30 mins ago.  When she woke up the discomfort continued.  She rates the pain at its worse 10/10 and currently at 8/10.  Lying still helps.  She is holding her left upper chest.  Hurts with movement.  Better at rest.  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 She is unsure if she is pregnant.  Using condoms off and on for contraception.  She is a patient of Dr. Ewell [facility] and [facility]. VSS in triage are WNL.     No past medical history on file.  Past Surgical History  Procedure Laterality Date  . Mandible fracture surgery      No family history on file.  History  Substance Use Topics  . Smoking status: Current Every Day Smoker -- 0.25 packs/day    Types: Cigarettes  . Smokeless tobacco: Never Used  . Alcohol Use: No    Allergies: No Known Allergies  Prescriptions prior to admission  Medication Sig Dispense Refill Last Dose  . oxyCODONE-acetaminophen (PERCOCET/ROXICET) 5-325 MG per tablet Take 1-2 tablets by mouth every 3 (three) hours as needed for severe pain (for pain scale > 4). 30 tablet 0   . Prenatal Vit-Fe Fumarate-FA (PRENATAL MULTIVITAMIN) TABS tablet Take 1 tablet by mouth daily at 12 noon.   Past Week at Unknown time    Review of Systems  Constitutional: Negative for fever and chills.  Cardiovascular: Positive for chest pain.  Gastrointestinal: Positive for nausea. Negative for vomiting and abdominal pain.   Genitourinary:       Neg for vaginal bleeding and discharge.   Musculoskeletal: Positive for back pain (upper mid back pain).  Neurological: Negative for headaches.   Physical Exam   Blood pressure 134/81, pulse 75, temperature 98.9 F (37.2 C), temperature source Oral, resp. rate 18, height 5' 4.5" (1.638 m), weight 155 lb (70.308 kg), SpO2 100 %, unknown if currently breastfeeding.  Physical Exam  Constitutional: She is oriented to person, place, and time. She appears well-developed and well-nourished. No distress.  Patient is lying comfortably without observed shortness of breath.  Able to text  HENT:  Head: Normocephalic.  Neck: Normal range of motion.  Cardiovascular: Normal rate, regular rhythm and normal heart sounds.   Respiratory: Effort normal and breath sounds normal. No respiratory distress. She has no wheezes. She has no rales. She exhibits tenderness.  GI: Soft. She exhibits no distension and no mass. There is no tenderness. There is no rebound and no guarding.  Neurological: She is alert and oriented to person, place, and time.  Skin: Skin is warm and dry.  Psychiatric: She has a normal mood and affect. Her behavior is normal.   Results for orders placed or performed during the hospital encounter of 08/05/14 (from the past 24 hour(s))  Pregnancy, urine POC     Status: None  Collection Time: 08/05/14  3:41 PM  Result Value Ref Range   Preg Test, Ur NEGATIVE NEGATIVE    ECG- unconfirmed report- NSR with sinus arrhythmia.  Ventricular rate 81.    MAU Course  Procedures  MDM 16:15  Reported MSE to Dr. Dareen Piano.  Order given to transfer to [facility] for further evaluation of Non OB/ non gyn sxs.   Reported ECG, VSS and plan of care with the patient.  She states she has to pick her children up.  Explained that I need to transfer to [facility] for further evaluation.  She is calling someone to pick them up. 16: 45  Patient agrees to go to [facility].  Her Mother will pick her children  up  16:48  Call to [facility] report given to Dr. Fredderick Phenix, who accepts transfer of patient.  She will go by [employer] [facility] and Plan  A:  Chest pain      Shortness of breath      Negative UPT  P:  Transfer to [facility] for further evaluation of chest pain/SOB  Alline Pio,EVE M 08/05/2014, 3:43 PM

## 2014-08-05 NOTE — Discharge Instructions (Signed)
Upper Respiratory Infection, Adult °An upper respiratory infection (URI) is also sometimes known as the common cold. The upper respiratory tract includes the nose, sinuses, throat, trachea, and bronchi. Bronchi are the airways leading to the lungs. Most people improve within 1 week, but symptoms can last up to 2 weeks. A residual cough may last even longer.  °CAUSES °Many different viruses can infect the tissues lining the upper respiratory tract. The tissues become irritated and inflamed and often become very moist. Mucus production is also common. A cold is contagious. You can easily spread the virus to others by oral contact. This includes kissing, sharing a glass, coughing, or sneezing. Touching your mouth or nose and then touching a surface, which is then touched by another person, can also spread the virus. °SYMPTOMS  °Symptoms typically develop 1 to 3 days after you come in contact with a cold virus. Symptoms vary from person to person. They may include: °· Runny nose. °· Sneezing. °· Nasal congestion. °· Sinus irritation. °· Sore throat. °· Loss of voice (laryngitis). °· Cough. °· Fatigue. °· Muscle aches. °· Loss of appetite. °· Headache. °· Low-grade fever. °DIAGNOSIS  °You might diagnose your own cold based on familiar symptoms, since most people get a cold 2 to 3 times a year. Your caregiver can confirm this based on your exam. Most importantly, your caregiver can check that your symptoms are not due to another disease such as strep throat, sinusitis, pneumonia, asthma, or epiglottitis. Blood tests, throat tests, and X-rays are not necessary to diagnose a common cold, but they may sometimes be helpful in excluding other more serious diseases. Your caregiver will decide if any further tests are required. °RISKS AND COMPLICATIONS  °You may be at risk for a more severe case of the common cold if you smoke cigarettes, have chronic heart disease (such as heart failure) or lung disease (such as asthma), or if  you have a weakened immune system. The very young and very old are also at risk for more serious infections. Bacterial sinusitis, middle ear infections, and bacterial pneumonia can complicate the common cold. The common cold can worsen asthma and chronic obstructive pulmonary disease (COPD). Sometimes, these complications can require emergency medical care and may be life-threatening. °PREVENTION  °The best way to protect against getting a cold is to practice good hygiene. Avoid oral or hand contact with people with cold symptoms. Wash your hands often if contact occurs. There is no clear evidence that vitamin C, vitamin E, echinacea, or exercise reduces the chance of developing a cold. However, it is always recommended to get plenty of rest and practice good nutrition. °TREATMENT  °Treatment is directed at relieving symptoms. There is no cure. Antibiotics are not effective, because the infection is caused by a virus, not by bacteria. Treatment may include: °· Increased fluid intake. Sports drinks offer valuable electrolytes, sugars, and fluids. °· Breathing heated mist or steam (vaporizer or shower). °· Eating chicken soup or other clear broths, and maintaining good nutrition. °· Getting plenty of rest. °· Using gargles or lozenges for comfort. °· Controlling fevers with ibuprofen or acetaminophen as directed by your caregiver. °· Increasing usage of your inhaler if you have asthma. °Zinc gel and zinc lozenges, taken in the first 24 hours of the common cold, can shorten the duration and lessen the severity of symptoms. Pain medicines may help with fever, muscle aches, and throat pain. A variety of non-prescription medicines are available to treat congestion and runny nose. Your caregiver   can make recommendations and may suggest nasal or lung inhalers for other symptoms.  °HOME CARE INSTRUCTIONS  °· Only take over-the-counter or prescription medicines for pain, discomfort, or fever as directed by your  caregiver. °· Use a warm mist humidifier or inhale steam from a shower to increase air moisture. This may keep secretions moist and make it easier to breathe. °· Drink enough water and fluids to keep your urine clear or pale yellow. °· Rest as needed. °· Return to work when your temperature has returned to normal or as your caregiver advises. You may need to stay home longer to avoid infecting others. You can also use a face mask and careful hand washing to prevent spread of the virus. °SEEK MEDICAL CARE IF:  °· After the first few days, you feel you are getting worse rather than better. °· You need your caregiver's advice about medicines to control symptoms. °· You develop chills, worsening shortness of breath, or brown or red sputum. These may be signs of pneumonia. °· You develop yellow or brown nasal discharge or pain in the face, especially when you bend forward. These may be signs of sinusitis. °· You develop a fever, swollen neck glands, pain with swallowing, or white areas in the back of your throat. These may be signs of strep throat. °SEEK IMMEDIATE MEDICAL CARE IF:  °· You have a fever. °· You develop severe or persistent headache, ear pain, sinus pain, or chest pain. °· You develop wheezing, a prolonged cough, cough up blood, or have a change in your usual mucus (if you have chronic lung disease). °· You develop sore muscles or a stiff neck. °Document Released: 08/11/2000 Document Revised: 05/10/2011 Document Reviewed: 05/23/2013 °ExitCare® Patient Information ©2015 [employer]. This information is not intended to replace advice given to you by your health care provider. Make sure you discuss any questions you have with your health care provider. ° ° °Chest Wall Pain °Chest wall pain is pain in or around the bones and muscles of your chest. It may take up to 6 weeks to get better. It may take longer if you must stay physically active in your work and activities.  °CAUSES  °Chest wall pain may happen on  its own. However, it may be caused by: °· A viral illness like the flu. °· Injury. °· Coughing. °· Exercise. °· Arthritis. °· Fibromyalgia. °· Shingles. °HOME CARE INSTRUCTIONS  °· Avoid overtiring physical activity. Try not to strain or perform activities that cause pain. This includes any activities using your chest or your abdominal and side muscles, especially if heavy weights are used. °· Put ice on the sore area. °¨ Put ice in a plastic bag. °¨ Place a towel between your skin and the bag. °¨ Leave the ice on for 15-20 minutes per hour while awake for the first 2 days. °· Only take over-the-counter or prescription medicines for pain, discomfort, or fever as directed by your caregiver. °SEEK IMMEDIATE MEDICAL CARE IF:  °· Your pain increases, or you are very uncomfortable. °· You have a fever. °· Your chest pain becomes worse. °· You have new, unexplained symptoms. °· You have nausea or vomiting. °· You feel sweaty or lightheaded. °· You have a cough with phlegm (sputum), or you cough up blood. °MAKE SURE YOU:  °· Understand these instructions. °· Will watch your condition. °· Will get help right away if you are not doing well or get worse. °Document Released: 02/15/2005 Document Revised: 05/10/2011 Document Reviewed: 10/12/2010 °  ExitCare Patient Information 2015 [place]. This information is not intended to replace advice given to you by your health care provider. Make sure you discuss any questions you have with your health care provider.  Back Pain, Adult Low back pain is very common. About 1 in 5 people have back pain.The cause of low back pain is rarely dangerous. The pain often gets better over time.About half of people with a sudden onset of back pain feel better in just 2 weeks. About 8 in 10 people feel better by 6 weeks.  CAUSES Some common causes of back pain include:  Strain of the muscles or ligaments supporting the spine.  Wear and tear (degeneration) of the spinal  discs.  Arthritis.  Direct injury to the back. DIAGNOSIS Most of the time, the direct cause of low back pain is not known.However, back pain can be treated effectively even when the exact cause of the pain is unknown.Answering your caregiver's questions about your overall health and symptoms is one of the most accurate ways to make sure the cause of your pain is not dangerous. If your caregiver needs more information, he or she may order lab work or imaging tests (X-rays or MRIs).However, even if imaging tests show changes in your back, this usually does not require surgery. HOME CARE INSTRUCTIONS For many people, back pain returns.Since low back pain is rarely dangerous, it is often a condition that people can learn to [facility] their own.   Remain active. It is stressful on the back to sit or stand in one place. Do not sit, drive, or stand in one place for more than 30 minutes at a time. Take short walks on level surfaces as soon as pain allows.Try to increase the length of time you walk each day.  Do not stay in bed.Resting more than 1 or 2 days can delay your recovery.  Do not avoid exercise or work.Your body is made to move.It is not dangerous to be active, even though your back may hurt.Your back will likely heal faster if you return to being active before your pain is gone.  Pay attention to your body when you bend and lift. Many people have less discomfortwhen lifting if they bend their knees, keep the load close to their bodies,and avoid twisting. Often, the most comfortable positions are those that put less stress on your recovering back.  Find a comfortable position to sleep. Use a firm mattress and lie on your side with your knees slightly bent. If you lie on your back, put a pillow under your knees.  Only take over-the-counter or prescription medicines as directed by your caregiver. Over-the-counter medicines to reduce pain and inflammation are often the most  helpful.Your caregiver may prescribe muscle relaxant drugs.These medicines help dull your pain so you can more quickly return to your normal activities and healthy exercise.  Put ice on the injured area.  Put ice in a plastic bag.  Place a towel between your skin and the bag.  Leave the ice on for 15-20 minutes, 03-04 times a day for the first 2 to 3 days. After that, ice and heat may be alternated to reduce pain and spasms.  Ask your caregiver about trying back exercises and gentle massage. This may be of some benefit.  Avoid feeling anxious or stressed.Stress increases muscle tension and can worsen back pain.It is important to recognize when you are anxious or stressed and learn ways to manage it.Exercise is a great option. SEEK MEDICAL CARE  IF:  You have pain that is not relieved with rest or medicine.  You have pain that does not improve in 1 week.  You have new symptoms.  You are generally not feeling well. SEEK IMMEDIATE MEDICAL CARE IF:   You have pain that radiates from your back into your legs.  You develop new bowel or bladder control problems.  You have unusual weakness or numbness in your arms or legs.  You develop nausea or vomiting.  You develop abdominal pain.  You feel faint. Document Released: 02/15/2005 Document Revised: 08/17/2011 Document Reviewed: 06/19/2013 [facility] Patient Information 2015 [place], [place]. This information is not intended to replace advice given to you by your health care provider. Make sure you discuss any questions you have with your health care provider.

## 2014-08-05 NOTE — ED Provider Notes (Signed)
CSN: [id number]     Arrival date & time 08/05/14  1523 History   First MD Initiated Contact with Patient 08/05/14 1816     Chief Complaint  Patient presents with  . Chest Pain     (Consider location/radiation/quality/duration/timing/severity/associated sxs/prior Treatment) HPI [name] is a 29 year old female with no significant past medical history of present ER complaining of chest discomfort. Patient states for the past 3 days she has had a nonproductive cough. She states this morning she woke up with a constant, central chest "sharp pain" which is also in her left lower rib cage, and her upper back. Patient states the pain has been mild throughout the day, worsened with cough, worsened with deep inspiration, worsened with movement of her arms. Patient reports one episode of shortness of breath this morning which has since subsided. Patient denies nausea, vomiting, palpitations, dizziness, weakness, headache, blurred vision, abdominal pain, dysuria.  History reviewed. No pertinent past medical history. Past Surgical History  Procedure Laterality Date  . Mandible fracture surgery     History reviewed. No pertinent family history. History  Substance Use Topics  . Smoking status: Current Every Day Smoker -- 0.50 packs/day    Types: Cigarettes  . Smokeless tobacco: Never Used  . Alcohol Use: Yes     Comment: occasional   OB History    Gravida Para Term Preterm AB TAB SAB Ectopic Multiple Living   0   3     Review of Systems  Constitutional: Negative for fever.  HENT: Negative for trouble swallowing.   Eyes: Negative for visual disturbance.  Respiratory: Positive for cough and chest tightness. Negative for shortness of breath.   Cardiovascular: Negative for chest pain.  Gastrointestinal: Negative for nausea, vomiting and abdominal pain.  Genitourinary: Negative for dysuria.  Musculoskeletal: Negative for neck pain.       Chest wall pain  Skin: Negative for rash.   Neurological: Negative for dizziness, weakness and numbness.  Psychiatric/Behavioral: Negative.       Allergies  Review of patient's allergies indicates no known allergies.  Home Medications   Prior to Admission medications   Medication Sig Start Date End Date Taking? Authorizing Provider  acetaminophen (TYLENOL) 500 MG tablet Take 1,000 mg by mouth every 6 (six) hours as needed for mild pain, moderate pain or headache.   Yes Historical Provider, MD  benzonatate (TESSALON) 100 MG capsule Take 1 capsule (100 mg total) by mouth every 8 (eight) hours. 08/05/14   Ladona Mow, PA-C  cyclobenzaprine (FLEXERIL) 10 MG tablet Take 1 tablet (10 mg total) by mouth 2 (two) times daily as needed for muscle spasms. 08/05/14   Ladona Mow, PA-C  ibuprofen (ADVIL,MOTRIN) 600 MG tablet Take 1 tablet (600 mg total) by mouth every 6 (six) hours as needed. 08/05/14   Ladona Mow, PA-C  oxyCODONE-acetaminophen (PERCOCET/ROXICET) 5-325 MG per tablet Take 1-2 tablets by mouth every 3 (three) hours as needed for severe pain (for pain scale > 4). Patient not taking: Reported on 08/05/2014 10/04/13   Levi Aland, MD   BP 125/66 mmHg  Pulse 65  Temp(Src) 98 F (36.7 C) (Oral)  Resp 18  Ht 5' 4.5" (1.638 m)  Wt 155 lb (70.308 kg)  BMI 26.20 kg/m2  SpO2 100%  LMP 07/11/2014 (Approximate) Physical Exam  Constitutional: She is oriented to person, place, and time. She appears well-developed and well-nourished. No distress.  HENT:  Head: Normocephalic and atraumatic.  Mouth/Throat: Oropharynx is clear  and moist. No oropharyngeal exudate.  Eyes: Right eye exhibits no discharge. Left eye exhibits no discharge. No scleral icterus.  Neck: Normal range of motion.  Cardiovascular: Normal rate, regular rhythm and normal heart sounds.   No murmur heard. Pulmonary/Chest: Effort normal and breath sounds normal. No accessory muscle usage. No tachypnea. No respiratory distress.      Abdominal: Soft. There is no tenderness.  There is no rigidity, no guarding, no tenderness at McBurney's point and negative Murphy's sign.  Musculoskeletal: Normal range of motion. She exhibits no edema or tenderness.  Neurological: She is alert and oriented to person, place, and time. No cranial nerve deficit. Coordination normal.  Skin: Skin is warm and dry. No rash noted. She is not diaphoretic.  Psychiatric: She has a normal mood and affect.  Nursing note and vitals reviewed.   ED Course  Procedures (including critical care time) Labs Review Labs Reviewed  URINALYSIS, ROUTINE W REFLEX MICROSCOPIC (NOT AT [facility]) - Abnormal; Notable for the following:    APPearance CLOUDY (*)    Specific Gravity, Urine >1.030 (*)    Hgb urine dipstick SMALL (*)    Leukocytes, UA MODERATE (*)    All other components within normal limits  URINE MICROSCOPIC-ADD ON - Abnormal; Notable for the following:    Squamous Epithelial / LPF MANY (*)    Bacteria, UA MANY (*)    All other components within normal limits  BASIC METABOLIC PANEL - Abnormal; Notable for the following:    Potassium 3.1 (*)    CO2 21 (*)    Glucose, Bld 144 (*)    All other components within normal limits  URINE CULTURE  CBC WITH DIFFERENTIAL/PLATELET  POCT PREGNANCY, URINE  I-STAT TROPOININ, ED    Imaging Review Dg Chest 2 View  08/05/2014   CLINICAL DATA:  Chest pain. Shortness of breath. Cough. Tobacco use.  EXAM: CHEST  2 VIEW  COMPARISON:  None.  FINDINGS: The heart size and mediastinal contours are within normal limits. Both lungs are clear. The visualized skeletal structures are unremarkable.  IMPRESSION: No active cardiopulmonary disease.   Electronically Signed   By: Gaylyn Rong M.D.   On: 08/05/2014 20:05     EKG Interpretation None      MDM   Final diagnoses:  Chest pain, unspecified chest pain type  Shortness of breath  Negative pregnancy test  Cough  Chest wall pain  Left-sided thoracic back pain    Patient here with atypical chest pain,  consistent with a musculoskeletal chest wall pain secondary to 3 days of cough.  Pt CXR negative for acute infiltrate. Patients symptoms are consistent with URI, likely viral etiology.  Chest pain is not likely of cardiac or pulmonary etiology d/t presentation, PERC negative, VSS, no tracheal deviation, no JVD or new murmur, RRR, breath sounds equal bilaterally, EKG without acute abnormalities, negative troponin greater than 6 hours after onset of symptoms, and negative CXR. Pt has been advised return to the ED is CP becomes exertional, associated with diaphoresis or nausea, radiates to left jaw/arm, worsens or becomes concerning in any way. After therapy here, patient states she's feeling much better. Patient states her pain is no longer chest, she is now stressing mostly back pain. This is all reproducible with palpation, and with range of motion. Neurologic exam is benign, no concern for cauda equina. UA shows moderate leukocytes, many bacteria, many squamous epithelial cells, we'll send culture. Patient denies symptoms of dysuria. Mild hypokalemia noted at 3.1.  Discussed this with pt, and gave pt option to replete with tablets versus dietary, pt prefers to replete with diet.  Patient afebrile, hemodynamically stable and in no acute distress. Patient stable for discharge. Discussed return impression with patient, patient verbalizes understanding of this plan.  BP 125/66 mmHg  Pulse 65  Temp(Src) 98 F (36.7 C) (Oral)  Resp 18  Ht 5' 4.5" (1.638 m)  Wt 155 lb (70.308 kg)  BMI 26.20 kg/m2  SpO2 100%  LMP 07/11/2014 (Approximate)  Signed,  Ladona MowJoe Briannah Lona, PA-C 11:32 PM  Patient seen and discussed with Dr. Benjiman CoreNathan Pickering, M.D.     Ladona MowJoe Vignesh Willert, PA-C 08/05/14 2332  Benjiman CoreNathan Pickering, MD 08/06/14 [phone]

## 2014-08-05 NOTE — MAU Note (Addendum)
Sharp pain in chest, sharp pain in heart.  Hurts when she breaths and coughs.  Started today. Has a runny nose.  [facility]

## 2014-10-21 ENCOUNTER — Inpatient Hospital Stay (HOSPITAL_COMMUNITY)
Admission: AD | Admit: 2014-10-21 | Discharge: 2014-10-21 | Disposition: A | Discharge status: Home / Self Care | Payer: Medicaid Other | Source: Ambulatory Visit | Attending: Obstetrics and Gynecology | Admitting: Obstetrics and Gynecology

## 2014-10-21 ENCOUNTER — Encounter (HOSPITAL_COMMUNITY): Payer: Self-pay | Admitting: *Deleted

## 2014-10-21 DIAGNOSIS — M546 Pain in thoracic spine: Secondary | ICD-10-CM | POA: Insufficient documentation

## 2014-10-21 DIAGNOSIS — O26891 Other specified pregnancy related conditions, first trimester: Secondary | ICD-10-CM | POA: Insufficient documentation

## 2014-10-21 DIAGNOSIS — N760 Acute vaginitis: Secondary | ICD-10-CM

## 2014-10-21 DIAGNOSIS — Z3A01 Less than 8 weeks gestation of pregnancy: Secondary | ICD-10-CM | POA: Diagnosis not present

## 2014-10-21 DIAGNOSIS — A499 Bacterial infection, unspecified: Secondary | ICD-10-CM | POA: Diagnosis not present

## 2014-10-21 DIAGNOSIS — O98811 Other maternal infectious and parasitic diseases complicating pregnancy, first trimester: Secondary | ICD-10-CM | POA: Insufficient documentation

## 2014-10-21 DIAGNOSIS — O99331 Smoking (tobacco) complicating pregnancy, first trimester: Secondary | ICD-10-CM | POA: Insufficient documentation

## 2014-10-21 DIAGNOSIS — N898 Other specified noninflammatory disorders of vagina: Secondary | ICD-10-CM | POA: Diagnosis present

## 2014-10-21 DIAGNOSIS — B9689 Other specified bacterial agents as the cause of diseases classified elsewhere: Secondary | ICD-10-CM

## 2014-10-21 LAB — CBC
HCT: 34 % — ABNORMAL LOW (ref 36.0–46.0)
Hemoglobin: 11.6 g/dL — ABNORMAL LOW (ref 12.0–15.0)
MCH: 30.7 pg (ref 26.0–34.0)
MCHC: 34.1 g/dL (ref 30.0–36.0)
MCV: 89.9 fL (ref 78.0–100.0)
Platelets: 254 10*3/uL (ref 150–400)
RBC: 3.78 MIL/uL — ABNORMAL LOW (ref 3.87–5.11)
RDW: 13.6 % (ref 11.5–15.5)
WBC: 10.8 10*3/uL — ABNORMAL HIGH (ref 4.0–10.5)

## 2014-10-21 LAB — URINALYSIS, ROUTINE W REFLEX MICROSCOPIC
BILIRUBIN URINE: NEGATIVE
Glucose, UA: NEGATIVE mg/dL
Hgb urine dipstick: NEGATIVE
Ketones, ur: 15 mg/dL — AB
NITRITE: NEGATIVE
Protein, ur: NEGATIVE mg/dL
SPECIFIC GRAVITY, URINE: 1.02 (ref 1.005–1.030)
UROBILINOGEN UA: 2 mg/dL — AB (ref 0.0–1.0)
pH: 7 (ref 5.0–8.0)

## 2014-10-21 LAB — WET PREP, GENITAL
TRICH WET PREP: NONE SEEN
Yeast Wet Prep HPF POC: NONE SEEN

## 2014-10-21 LAB — URINE MICROSCOPIC-ADD ON

## 2014-10-21 LAB — POCT PREGNANCY, URINE: PREG TEST UR: POSITIVE — AB

## 2014-10-21 MED ORDER — CYCLOBENZAPRINE HCL 10 MG PO TABS: 10.0000 mg | ORAL_TABLET | Freq: Three times a day (TID) | ORAL | Status: DC | PRN

## 2014-10-21 MED ORDER — METRONIDAZOLE 500 MG PO TABS: 500.0000 mg | ORAL_TABLET | Freq: Two times a day (BID) | ORAL | Status: DC

## 2014-10-21 NOTE — MAU Provider Note (Signed)
History     CSN: [id number]  Arrival date and time: 10/21/14 1800   First Provider Initiated Contact with Patient 10/21/14 1936      Chief Complaint  Patient presents with  . Back Pain  . Possible Pregnancy  . Vaginal Discharge   HPI  [name] is a 29 y.o. [phone] at [redacted]w[redacted]d who presents for vaginal discharge and back pain Pt states she pulled a muscle this afternoon while at work while pushing a cart. States it's the same muscle she strained a few months ago. Rates pain 8/10 & describes as achy. Located at mid back. Previously took flexeril with relief.   Vaginal discharge x 2 weeks. Denies vaginal irritation. States discharge is thick & white; malodorous. Denies vaginal bleeding or abdominal pain. Requesting STD testing at this time.  Patient thinks she is pregnant; had positive pregnancy test at home. LMP was the end of July. Patient has appointment with [facility] on Thursday for abortion.  History reviewed. No pertinent past medical history.  Past Surgical History  Procedure Laterality Date  . Mandible fracture surgery      No family history on file.  Social History  Substance Use Topics  . Smoking status: Current Every Day Smoker -- 0.50 packs/day    Types: Cigarettes  . Smokeless tobacco: Never Used  . Alcohol Use: Yes     Comment: occasional    Allergies: No Known Allergies  Prescriptions prior to admission  Medication Sig Dispense Refill Last Dose  . acetaminophen (TYLENOL) 500 MG tablet Take 1,000 mg by mouth every 6 (six) hours as needed for mild pain, moderate pain or headache.   Not Taking at Unknown time  . benzonatate (TESSALON) 100 MG capsule Take 1 capsule (100 mg total) by mouth every 8 (eight) hours. (Patient not taking: Reported on 10/21/2014) 21 capsule 0     Review of Systems  Constitutional: Negative.  Negative for fever.  Respiratory: Negative.   Cardiovascular: Negative.   Gastrointestinal: Negative.  Negative for abdominal pain.   Genitourinary: Negative for dysuria.       Positive for vaginal discharge Negative for vaginal bleeding  Musculoskeletal: Positive for back pain.   Physical Exam  BP 132/61 mmHg  Pulse 87  Temp(Src) 98.5 F (36.9 C) (Oral)  LMP 08/26/2014 (Approximate)  Breastfeeding? No   Physical Exam  Constitutional: She appears well-developed and well-nourished. No distress.  Cardiovascular: Normal rate, regular rhythm and normal heart sounds.   Respiratory: Effort normal and breath sounds normal. No respiratory distress.  GI: Soft. Bowel sounds are normal. She exhibits no distension. There is no tenderness.  Genitourinary: Vagina normal and uterus normal. Cervix exhibits discharge (moderate amount of off white discharge; strong odor). Cervix exhibits no motion tenderness and no friability.  Cervix closed  Skin: Skin is warm and dry. She is not diaphoretic. No erythema.  Psychiatric: She has a normal mood and affect. Her behavior is normal. Judgment and thought content normal.    MAU Course  Procedures Results for orders placed or performed during the hospital encounter of 10/21/14 (from the past 24 hour(s))  Urinalysis, Routine w reflex microscopic (not at [facility])     Status: Abnormal   Collection Time: 10/21/14  6:05 PM  Result Value Ref Range   Color, Urine YELLOW YELLOW   APPearance CLEAR CLEAR   Specific Gravity, Urine 1.020 1.005 - 1.030   pH 7.0 5.0 - 8.0   Glucose, UA NEGATIVE NEGATIVE mg/dL   Hgb urine dipstick  NEGATIVE NEGATIVE   Bilirubin Urine NEGATIVE NEGATIVE   Ketones, ur 15 (A) NEGATIVE mg/dL   Protein, ur NEGATIVE NEGATIVE mg/dL   Urobilinogen, UA 2.0 (H) 0.0 - 1.0 mg/dL   Nitrite NEGATIVE NEGATIVE   Leukocytes, UA SMALL (A) NEGATIVE  Urine microscopic-add on     Status: Abnormal   Collection Time: 10/21/14  6:05 PM  Result Value Ref Range   Squamous Epithelial / LPF FEW (A) RARE   WBC, UA 0-2 <3 WBC/hpf   RBC / HPF 0-2 <3 RBC/hpf   Bacteria, UA RARE RARE    Urine-Other MUCOUS PRESENT   Pregnancy, urine POC     Status: Abnormal   Collection Time: 10/21/14  7:04 PM  Result Value Ref Range   Preg Test, Ur POSITIVE (A) NEGATIVE  CBC     Status: Abnormal   Collection Time: 10/21/14  7:54 PM  Result Value Ref Range   WBC 10.8 (H) 4.0 - 10.5 K/uL   RBC 3.78 (L) 3.87 - 5.11 MIL/uL   Hemoglobin 11.6 (L) 12.0 - 15.0 g/dL   HCT 16.1 (L) 09.6 - 04.5 %   MCV 89.9 78.0 - 100.0 fL   MCH 30.7 26.0 - 34.0 pg   MCHC 34.1 30.0 - 36.0 g/dL   RDW 40.9 81.1 - 91.4 %   Platelets 254 150 - 400 K/uL  Wet prep, genital     Status: Abnormal   Collection Time: 10/21/14  8:00 PM  Result Value Ref Range   Yeast Wet Prep HPF POC NONE SEEN NONE SEEN   Trich, Wet Prep NONE SEEN NONE SEEN   Clue Cells Wet Prep HPF POC MODERATE (A) NONE SEEN   WBC, Wet Prep HPF POC MODERATE (A) NONE SEEN   MDM CBC/HIV/RPR/GC/CT/wetprep Hot pack to back OB urine culture sent 2025- Care turned over to [facility] CNM         Judeth Horn, FNP-C, 822/ 2031  Reviewed results and treatment plan with the patient for DC home.   Assessment and Plan    1. Bacterial vaginal infection   2. Bilateral thoracic back pain    DC home Comfort measures reviewed  1st Trimester precautions  RX: flagyl and flexeril  Return to MAU as needed FU with OB as planned  Follow-up Information    Follow up with Levi Aland, MD.   Specialty:  Obstetrics and Gynecology   Why:  As needed   Contact information:   [address] [place] [address] [phone]

## 2014-10-21 NOTE — MAU Note (Signed)
Pt states pulled muscle this am. Also concerned she may be pregnant. Had odorous vaginal discharge as well.

## 2014-10-21 NOTE — Discharge Instructions (Signed)
Bacterial Vaginosis Bacterial vaginosis is a vaginal infection that occurs when the normal balance of bacteria in the vagina is disrupted. It results from an overgrowth of certain bacteria. This is the most common vaginal infection in women of childbearing age. Treatment is important to prevent complications, especially in pregnant women, as it can cause a premature delivery. CAUSES  Bacterial vaginosis is caused by an increase in harmful bacteria that are normally present in smaller amounts in the vagina. Several different kinds of bacteria can cause bacterial vaginosis. However, the reason that the condition develops is not fully understood. RISK FACTORS Certain activities or behaviors can put you at an increased risk of developing bacterial vaginosis, including:  Having a new sex partner or multiple sex partners.  Douching.  Using an intrauterine device (IUD) for contraception. Women do not get bacterial vaginosis from toilet seats, bedding, swimming pools, or contact with objects around them. SIGNS AND SYMPTOMS  Some women with bacterial vaginosis have no signs or symptoms. Common symptoms include:  Grey vaginal discharge.  A fishlike odor with discharge, especially after sexual intercourse.  Itching or burning of the vagina and vulva.  Burning or pain with urination. DIAGNOSIS  Your health care provider will take a medical history and examine the vagina for signs of bacterial vaginosis. A sample of vaginal fluid may be taken. Your health care provider will look at this sample under a microscope to check for bacteria and abnormal cells. A vaginal pH test may also be done.  TREATMENT  Bacterial vaginosis may be treated with antibiotic medicines. These may be given in the form of a pill or a vaginal cream. A second round of antibiotics may be prescribed if the condition comes back after treatment.  HOME CARE INSTRUCTIONS   Only take over-the-counter or prescription medicines as  directed by your health care provider.  If antibiotic medicine was prescribed, take it as directed. Make sure you finish it even if you start to feel better.  Do not have sex until treatment is completed.  Tell all sexual partners that you have a vaginal infection. They should see their health care provider and be treated if they have problems, such as a mild rash or itching.  Practice safe sex by using condoms and only having one sex partner. SEEK MEDICAL CARE IF:   Your symptoms are not improving after 3 days of treatment.  You have increased discharge or pain.  You have a fever. MAKE SURE YOU:   Understand these instructions.  Will watch your condition.  Will get help right away if you are not doing well or get worse. FOR MORE INFORMATION  Centers for Disease Control and Prevention, Division of STD Prevention: www.cdc.gov/std American Sexual Health Association (ASHA): www.ashastd.org  Document Released: 02/15/2005 Document Revised: 12/06/2012 Document Reviewed: 09/27/2012 ExitCare Patient Information 2015 [employer]. This information is not intended to replace advice given to you by your health care provider. Make sure you discuss any questions you have with your health care provider.  

## 2014-10-22 ENCOUNTER — Telehealth: Payer: Self-pay | Admitting: *Deleted

## 2014-10-22 DIAGNOSIS — A749 Chlamydial infection, unspecified: Secondary | ICD-10-CM

## 2014-10-22 DIAGNOSIS — O98811 Other maternal infectious and parasitic diseases complicating pregnancy, first trimester: Principal | ICD-10-CM

## 2014-10-22 LAB — HIV ANTIBODY (ROUTINE TESTING W REFLEX): HIV Screen 4th Generation wRfx: NONREACTIVE

## 2014-10-22 LAB — GC/CHLAMYDIA PROBE AMP (~~LOC~~) NOT AT ARMC
Chlamydia: POSITIVE — AB
Neisseria Gonorrhea: NEGATIVE

## 2014-10-22 LAB — RPR: RPR Ser Ql: NONREACTIVE

## 2014-10-22 MED ORDER — AZITHROMYCIN 500 MG PO TABS: ORAL_TABLET | ORAL | Status: DC

## 2014-10-22 NOTE — Telephone Encounter (Signed)
Telephone call to patient regarding positive chlamydia culture, patient notified.  Patient has not been treated, Rx routed to pharmacy per protocol.  Instructed patient to notify her partner for treatment and to abstain from sex for seven days post treatment.

## 2014-10-23 LAB — CULTURE, OB URINE

## 2015-02-05 ENCOUNTER — Emergency Department (HOSPITAL_COMMUNITY)
Admission: EM | Admit: 2015-02-05 | Discharge: 2015-02-05 | Disposition: A | Discharge status: Home / Self Care | Payer: Medicaid Other | Attending: Emergency Medicine | Admitting: Emergency Medicine

## 2015-02-05 ENCOUNTER — Emergency Department (HOSPITAL_COMMUNITY): Payer: Medicaid Other

## 2015-02-05 ENCOUNTER — Encounter (HOSPITAL_COMMUNITY): Payer: Self-pay | Admitting: Emergency Medicine

## 2015-02-05 DIAGNOSIS — R102 Pelvic and perineal pain: Secondary | ICD-10-CM

## 2015-02-05 DIAGNOSIS — R1031 Right lower quadrant pain: Secondary | ICD-10-CM | POA: Diagnosis present

## 2015-02-05 DIAGNOSIS — F17210 Nicotine dependence, cigarettes, uncomplicated: Secondary | ICD-10-CM | POA: Diagnosis not present

## 2015-02-05 DIAGNOSIS — Z3202 Encounter for pregnancy test, result negative: Secondary | ICD-10-CM | POA: Insufficient documentation

## 2015-02-05 DIAGNOSIS — N83201 Unspecified ovarian cyst, right side: Secondary | ICD-10-CM | POA: Diagnosis not present

## 2015-02-05 LAB — WET PREP, GENITAL
CLUE CELLS WET PREP: NONE SEEN
SPERM: NONE SEEN
TRICH WET PREP: NONE SEEN
Yeast Wet Prep HPF POC: NONE SEEN

## 2015-02-05 LAB — URINALYSIS, ROUTINE W REFLEX MICROSCOPIC
Bilirubin Urine: NEGATIVE
Glucose, UA: NEGATIVE mg/dL
Hgb urine dipstick: NEGATIVE
Ketones, ur: NEGATIVE mg/dL
LEUKOCYTES UA: NEGATIVE
NITRITE: NEGATIVE
PROTEIN: NEGATIVE mg/dL
Specific Gravity, Urine: 1.03 (ref 1.005–1.030)
pH: 6.5 (ref 5.0–8.0)

## 2015-02-05 LAB — GC/CHLAMYDIA PROBE AMP (~~LOC~~) NOT AT ARMC
CHLAMYDIA, DNA PROBE: NEGATIVE
NEISSERIA GONORRHEA: NEGATIVE

## 2015-02-05 LAB — HIV ANTIBODY (ROUTINE TESTING W REFLEX): HIV Screen 4th Generation wRfx: NONREACTIVE

## 2015-02-05 LAB — POC URINE PREG, ED: Preg Test, Ur: NEGATIVE

## 2015-02-05 MED ORDER — CEFTRIAXONE SODIUM 250 MG IJ SOLR
250.0000 mg | Freq: Once | INTRAMUSCULAR | Status: AC
Start: 2015-02-05 — End: 2015-02-05
  Administered 2015-02-05: 250 mg via INTRAMUSCULAR
  Filled 2015-02-05: qty 250

## 2015-02-05 MED ORDER — DIPHENHYDRAMINE HCL 25 MG PO CAPS: 50.0000 mg | ORAL_CAPSULE | Freq: Once | ORAL | Status: DC

## 2015-02-05 MED ORDER — AZITHROMYCIN 250 MG PO TABS
1000.0000 mg | ORAL_TABLET | Freq: Once | ORAL | Status: AC
  Administered 2015-02-05: 1000 mg via ORAL
  Filled 2015-02-05: qty 4

## 2015-02-05 MED ORDER — LIDOCAINE HCL (PF) 1 % IJ SOLN
INTRAMUSCULAR | Status: AC
  Administered 2015-02-05: 0.9 mL
  Filled 2015-02-05: qty 5

## 2015-02-05 NOTE — ED Notes (Signed)
Pt states "I think my boyfriend is cheating on me and gave me something." reports vaginal discharge

## 2015-02-05 NOTE — Discharge Instructions (Signed)
Return without fail for worsening symptoms including worsening pain, vomiting unable to keep down food or fluids, fever, or any other symptoms concerning to you. Please take Motrin and Tylenol as needed for pain control.  Pelvic Pain, Female Female pelvic pain can be caused by many different things and start from a variety of places. Pelvic pain refers to pain that is located in the lower half of the abdomen and between your hips. The pain may occur over a short period of time (acute) or may be reoccurring (chronic). The cause of pelvic pain may be related to disorders affecting the female reproductive organs (gynecologic), but it may also be related to the bladder, kidney stones, an intestinal complication, or muscle or skeletal problems. Getting help right away for pelvic pain is important, especially if there has been severe, sharp, or a sudden onset of unusual pain. It is also important to get help right away because some types of pelvic pain can be life threatening.  CAUSES  Below are only some of the causes of pelvic pain. The causes of pelvic pain can be in one of several categories.   Gynecologic.  Pelvic inflammatory disease.  Sexually transmitted infection.  Ovarian cyst or a twisted ovarian ligament (ovarian torsion).  Uterine lining that grows outside the uterus (endometriosis).  Fibroids, cysts, or tumors.  Ovulation.  Pregnancy.  Pregnancy that occurs outside the uterus (ectopic pregnancy).  Miscarriage.  Labor.  Abruption of the placenta or ruptured uterus.  Infection.  Uterine infection (endometritis).  Bladder infection.  Diverticulitis.  Miscarriage related to a uterine infection (septic abortion).  Bladder.  Inflammation of the bladder (cystitis).  Kidney stone(s).  Gastrointestinal.  Constipation.  Diverticulitis.  Neurologic.  Trauma.  Feeling pelvic pain because of mental or emotional causes (psychosomatic).  Cancers of the bowel or  pelvis. EVALUATION  Your caregiver will want to take a careful history of your concerns. This includes recent changes in your health, a careful gynecologic history of your periods (menses), and a sexual history. Obtaining your family history and medical history is also important. Your caregiver may suggest a pelvic exam. A pelvic exam will help identify the location and severity of the pain. It also helps in the evaluation of which organ system may be involved. In order to identify the cause of the pelvic pain and be properly treated, your caregiver may order tests. These tests may include:   A pregnancy test.  Pelvic ultrasonography.  An X-ray exam of the abdomen.  A urinalysis or evaluation of vaginal discharge.  Blood tests. HOME CARE INSTRUCTIONS   Only take over-the-counter or prescription medicines for pain, discomfort, or fever as directed by your caregiver.   Rest as directed by your caregiver.   Eat a balanced diet.   Drink enough fluids to make your urine clear or pale yellow, or as directed.   Avoid sexual intercourse if it causes pain.   Apply warm or cold compresses to the lower abdomen depending on which one helps the pain.   Avoid stressful situations.   Keep a journal of your pelvic pain. Write down when it started, where the pain is located, and if there are things that seem to be associated with the pain, such as food or your menstrual cycle.  Follow up with your caregiver as directed.  SEEK MEDICAL CARE IF:  Your medicine does not help your pain.  You have abnormal vaginal discharge. SEEK IMMEDIATE MEDICAL CARE IF:   You have heavy bleeding from the  vagina.   Your pelvic pain increases.   You feel light-headed or faint.   You have chills.   You have pain with urination or blood in your urine.   You have uncontrolled diarrhea or vomiting.   You have a fever or persistent symptoms for more than 3 days.  You have a fever and your  symptoms suddenly get worse.   You are being physically or sexually abused.   This information is not intended to replace advice given to you by your health care provider. Make sure you discuss any questions you have with your health care provider.   Document Released: 01/13/2004 Document Revised: 11/06/2014 Document Reviewed: 06/07/2011 Elsevier Interactive Patient Education [employer]

## 2015-02-05 NOTE — ED Provider Notes (Signed)
CSN: [id number]     Arrival date & time 02/05/15  0540 History   First MD Initiated Contact with Patient 02/05/15 [phone]     Chief Complaint  Patient presents with  . Abdominal Pain     (Consider location/radiation/quality/duration/timing/severity/associated sxs/prior Treatment) HPI 29 year old female who presents with right lower abdominal pain and vaginal discharge. States that her boyfriend has been unfaithful to her, and for the past 2 weeks she has had abnormal vaginal discharge. Also states that she had an elective abortion in March 2016 and since then has been having irregular menses. States that her right lower abdominal pain started yesterday, is sharp in nature, nonradiating. Denies any vomiting, diarrhea, dysuria, or urinary frequency. No prior abdominal surgeries. History reviewed. No pertinent past medical history. Past Surgical History  Procedure Laterality Date  . Mandible fracture surgery     History reviewed. No pertinent family history. Social History  Substance Use Topics  . Smoking status: Current Every Day Smoker -- 0.50 packs/day    Types: Cigarettes  . Smokeless tobacco: Never Used  . Alcohol Use: Yes     Comment: occasional   OB History    Gravida Para Term Preterm AB TAB SAB Ectopic Multiple Living   0   3     Review of Systems 10/14 systems reviewed and are negative other than those stated in the HPI    Allergies  Review of patient's allergies indicates no known allergies.  Home Medications   Prior to Admission medications   Medication Sig Start Date End Date Taking? Authorizing Provider  azithromycin (ZITHROMAX) 500 MG tablet Take two tablets by mouth once. Patient not taking: Reported on 02/05/2015 10/22/14   Armando Reichert, CNM  benzonatate (TESSALON) 100 MG capsule Take 1 capsule (100 mg total) by mouth every 8 (eight) hours. Patient not taking: Reported on 10/21/2014 08/05/14   Ladona Mow, PA-C  cyclobenzaprine (FLEXERIL) 10 MG tablet  Take 1 tablet (10 mg total) by mouth 3 (three) times daily as needed for muscle spasms. Patient not taking: Reported on 02/05/2015 10/21/14   Armando Reichert, CNM  metroNIDAZOLE (FLAGYL) 500 MG tablet Take 1 tablet (500 mg total) by mouth 2 (two) times daily. Patient not taking: Reported on 02/05/2015 10/21/14   Armando Reichert, CNM   BP 126/63 mmHg  Pulse 68  Temp(Src) 98 F (36.7 C) (Oral)  Resp 16  Ht  (1.651 m)  Wt 150 lb (68.04 kg)  BMI 24.96 kg/m2  SpO2 98%  LMP 02/03/2015 (Exact Date)  Breastfeeding? Unknown Physical Exam Physical Exam  Nursing note and vitals reviewed. Constitutional: Well developed, well nourished, non-toxic, and in no acute distress Head: Normocephalic and atraumatic.  Mouth/Throat: Oropharynx is clear and moist.  Neck: Normal range of motion. Neck supple.  Cardiovascular: Normal rate and regular rhythm.   Pulmonary/Chest: Effort normal and breath sounds normal.  Abdominal: Soft. Non-distended. No CVA tenderness. There is tenderness in the right adnexa. No tenderness at McBurney's point. There is no rebound and no guarding.  Pelvic: Normal external genitalia. Normal internal genitalia. Moderate white vaginal discharge. No blood within the vagina. No cervical motion tenderness. No adnexal masses. Right adnexal tenderness. Musculoskeletal: Normal range of motion.  Neurological: Alert, fluent speech Skin: Skin is warm and dry.  Psychiatric: Cooperative  ED Course  Procedures (including critical care time) Labs Review Labs Reviewed  WET PREP, GENITAL - Abnormal; Notable for the following:    WBC, Wet Prep  HPF POC MANY (*)    All other components within normal limits  URINALYSIS, ROUTINE W REFLEX MICROSCOPIC (NOT AT [facility])  HIV ANTIBODY (ROUTINE TESTING)  POC URINE PREG, ED  GC/CHLAMYDIA PROBE AMP ([place]) NOT AT [facility]    Imaging Review US Transvaginal Non-ob  02/05/2015  CLINICAL DATA:  Prior quadrant pain. EXAM: TRANSABDOMINAL AND  TRANSVAGINAL ULTRASOUND OF PELVIS TECHNIQUE: Both transabdominal and transvaginal ultrasound examinations of the pelvis were performed. Transabdominal technique was performed for global imaging of the pelvis including uterus, ovaries, adnexal regions, and pelvic cul-de-sac. It was necessary to proceed with endovaginal exam following the transabdominal exam to visualize the uterus and over. COMPARISON:  No prior. FINDINGS: Uterus Measurements: 8.7 x 4.8 x 6.0 cm. No fibroids or other mass visualized. Endometrium Thickness: 6.9 mm.  No focal abnormality visualized. Right ovary Measurements: 2.3 x 1.8 x 2.5 cm. 1.2 x 0.7 x 1.2 cm cm simple cyst consistent with follicular cyst . Left ovary Measurements: 2.3 x 2.0 x 3.5 cm. Normal appearance/no adnexal mass. Other findings Small amount of free pelvic fluid . IMPRESSION: 1.2 cm simple cyst left ovary consistent follicular cyst. Small amount of free pelvic fluid. Exam otherwise unremarkable. Electronically Signed   By: Maisie Fus  Register   On: 02/05/2015 09:30   US Pelvis Complete  02/05/2015  CLINICAL DATA:  Right lower quadrant pain. EXAM: TRANSABDOMINAL AND TRANSVAGINAL ULTRASOUND OF PELVIS TECHNIQUE: Both transabdominal and transvaginal ultrasound examinations of the pelvis were performed. Transabdominal technique was performed for global imaging of the pelvis including uterus, ovaries, adnexal regions, and pelvic cul-de-sac. It was necessary to proceed with endovaginal exam following the transabdominal exam to visualize the uterus and ovaries. COMPARISON:  None FINDINGS: Uterus Measurements: 8.7 x 4.8 x 6.0 cm. No fibroids or other mass visualized. Endometrium Thickness: 6.9 mm.  No focal abnormality visualized. Right ovary Measurements: 2.3 x 1.8 x 2.5 cm. Small 1.2 cm cyst, most likely small follicular cyst. Left ovary Measurements: 2.3 x 2.0 x 3.5 cm. Normal appearance/no adnexal mass. Other findings Small amount of free pelvic fluid. IMPRESSION: Small 1.2 cm  follicular cyst right ovary. Small amount of free pelvic fluid. Exam otherwise unremarkable. Electronically Signed   By: Maisie Fus  Register   On: 02/05/2015 09:28      I have personally reviewed and evaluated these images and lab results as part of my medical decision-making.    MDM   Final diagnoses:  Pelvic pain in female  Cyst of right ovary    29 year old female, otherwise healthy, who presents with 2 weeks of vaginal discharge and one day of right sided pelvic pain. She is well-appearing and in no acute distress. Vital signs are not concerning in the emergency department. She is a soft and nonsurgical abdomen. She does have some right adnexal  tenderness to palpation on exam. No tenderness at McBurney's point or flank tenderness to suggest retroperitoneal or intra-abdominal process. Pelvic exam reveals moderate vaginal discharge. Wet prep is negative for BV, yeast, or Trichomonas. Is empirically treated with ceftriaxone and azithromycin as there are multiple wbc's on her wet prep. She is not pregnant and her UA is not suggestive of infection. Pelvic ultrasound is performed to rule out hydrosalpinx/TOA. This shows small right ovarian cyst. No other acute intrapelvic process. At this time, no significant suspicion for PID and no suprapubic tenderness or cervical motion tenderness. Given warning signs for ovarian torsion, and given strict return and follow-up instructions. She is breast understanding of all discharge instructions  and felt comfortable with the plan of care.    Lavera Guiseana Duo Zaela Graley, MD 02/05/15 1006

## 2015-02-05 NOTE — ED Notes (Signed)
Pt arrives with sudden onset RLQ abdominal pain, states started suddenly yesterday and wasn't able to sleep. Nausea, no vomiting or diarrhea.

## 2015-08-18 ENCOUNTER — Emergency Department (HOSPITAL_COMMUNITY): Payer: Medicaid Other

## 2015-08-18 ENCOUNTER — Encounter (HOSPITAL_COMMUNITY): Payer: Self-pay | Admitting: Emergency Medicine

## 2015-08-18 ENCOUNTER — Emergency Department (HOSPITAL_COMMUNITY)
Admission: EM | Admit: 2015-08-18 | Discharge: 2015-08-18 | Disposition: A | Discharge status: Home / Self Care | Payer: Medicaid Other | Attending: Emergency Medicine | Admitting: Emergency Medicine

## 2015-08-18 DIAGNOSIS — Y939 Activity, unspecified: Secondary | ICD-10-CM | POA: Insufficient documentation

## 2015-08-18 DIAGNOSIS — S6992XA Unspecified injury of left wrist, hand and finger(s), initial encounter: Secondary | ICD-10-CM

## 2015-08-18 DIAGNOSIS — M795 Residual foreign body in soft tissue: Secondary | ICD-10-CM

## 2015-08-18 DIAGNOSIS — S90852A Superficial foreign body, left foot, initial encounter: Secondary | ICD-10-CM | POA: Insufficient documentation

## 2015-08-18 DIAGNOSIS — S99922A Unspecified injury of left foot, initial encounter: Secondary | ICD-10-CM

## 2015-08-18 DIAGNOSIS — F17210 Nicotine dependence, cigarettes, uncomplicated: Secondary | ICD-10-CM | POA: Diagnosis not present

## 2015-08-18 DIAGNOSIS — Y999 Unspecified external cause status: Secondary | ICD-10-CM | POA: Diagnosis not present

## 2015-08-18 DIAGNOSIS — Z79899 Other long term (current) drug therapy: Secondary | ICD-10-CM | POA: Diagnosis not present

## 2015-08-18 DIAGNOSIS — Y929 Unspecified place or not applicable: Secondary | ICD-10-CM | POA: Insufficient documentation

## 2015-08-18 DIAGNOSIS — W25XXXA Contact with sharp glass, initial encounter: Secondary | ICD-10-CM | POA: Insufficient documentation

## 2015-08-18 MED ORDER — NAPROXEN 500 MG PO TABS: 500.0000 mg | ORAL_TABLET | Freq: Two times a day (BID) | ORAL | Status: DC

## 2015-08-18 MED ORDER — IBUPROFEN 400 MG PO TABS
800.0000 mg | ORAL_TABLET | Freq: Once | ORAL | Status: AC
  Administered 2015-08-18: 800 mg via ORAL
  Filled 2015-08-18: qty 2

## 2015-08-18 MED ORDER — IBUPROFEN 800 MG PO TABS: 800.0000 mg | ORAL_TABLET | Freq: Three times a day (TID) | ORAL | Status: DC

## 2015-08-18 NOTE — ED Provider Notes (Signed)
CSN: [id number]     Arrival date & time 08/18/15  0749 History   First MD Initiated Contact with Patient 08/18/15 [phone]     Chief Complaint  Patient presents with  . Wrist Injury  . foreign body in foot      (Consider location/radiation/quality/duration/timing/severity/associated sxs/prior Treatment) HPI   [name] is a 30 y.o. female, patient with no pertinent past medical history, presenting to the ED with injuries to the left hand and left foot that occurred yesterday. Patient states she tried to catch a falling glass table while she was moving furniture yesterday. Table hit her left wrist and then broke. She then stepped on a piece of glass. Patient is concerned piece of glass is still in her foot. Patient denies neuro deficits, head trauma, LOC, or any other complaints.  History reviewed. No pertinent past medical history. Past Surgical History  Procedure Laterality Date  . Mandible fracture surgery     No family history on file. Social History  Substance Use Topics  . Smoking status: Current Every Day Smoker -- 0.50 packs/day    Types: Cigarettes  . Smokeless tobacco: Never Used  . Alcohol Use: Yes     Comment: occasional   OB History    Gravida Para Term Preterm AB TAB SAB Ectopic Multiple Living   0   3     Review of Systems  Constitutional: Negative for fever and chills.  Musculoskeletal: Positive for arthralgias. Negative for back pain and neck pain.  Neurological: Negative for weakness and numbness.      Allergies  Review of patient's allergies indicates no known allergies.  Home Medications   Prior to Admission medications   Medication Sig Start Date End Date Taking? Authorizing Provider  ibuprofen (ADVIL,MOTRIN) 800 MG tablet Take 1 tablet (800 mg total) by mouth 3 (three) times daily. 08/18/15   Ranada Vigorito C Latishia Suitt, PA-C   BP 136/82 mmHg  Pulse 93  Temp(Src) 99.6 F (37.6 C) (Oral)  Resp 16  Ht  (1.676 m)  Wt 65.772 kg  BMI 23.41  kg/m2  SpO2 100%  LMP 07/28/2014 (Approximate) Physical Exam  Constitutional: She appears well-developed and well-nourished. No distress.  HENT:  Head: Normocephalic and atraumatic.  Eyes: Conjunctivae are normal.  Neck: Neck supple.  Cardiovascular: Normal rate and regular rhythm.   Pulmonary/Chest: Effort normal.  Musculoskeletal:  Full range of motion in the left hand, wrist, and foot. Some tenderness and very minor swelling to the volar surface of the left hand. Tenderness to the left lateral foot.  Neurological: She is alert.  No sensory deficits. Strength in the left hand and foot 5 out of 5.  Skin: Skin is warm and dry. She is not diaphoretic.  Psychiatric: She has a normal mood and affect. Her behavior is normal.  Nursing note and vitals reviewed.   ED Course  Procedures (including critical care time)  Imaging Review [place] Extrem [employer]  08/18/2015  CLINICAL DATA:  Stepped on a piece of glass yesterday. Possible foreign body in left lateral foot. EXAM: ULTRASOUND LEFT LOWER EXTREMITY LIMITED TECHNIQUE: Ultrasound examination of the lower extremity soft tissues was performed in the area of clinical concern. COMPARISON:  Plain films 08/18/2015 FINDINGS: Ultrasound was performed in the area could of concern within the left lateral midfoot. No visible radiopaque foreign body. No fluid collection. IMPRESSION: The previously seen foreign body by plain films was not appreciated by ultrasound. Electronically Signed  By: Charlett NoseKevin  Dover M.D.   On: 08/18/2015 09:55   Dg Hand Complete Left  08/18/2015  CLINICAL DATA:  Twisted left and wall picking up a table. EXAM: LEFT HAND - COMPLETE 3+ VIEW COMPARISON:  None. FINDINGS: There is no evidence of fracture or dislocation. There is ulnar minus variance. There is no evidence of arthropathy or other focal bone abnormality. Soft tissues are unremarkable. IMPRESSION: Negative. Electronically Signed   By: Elige KoHetal  Patel   On: 08/18/2015 08:28   Dg  Foot Complete Left  08/18/2015  CLINICAL DATA:  30 year old female with a history of glass injury EXAM: LEFT FOOT - COMPLETE 3+ VIEW COMPARISON:  None. FINDINGS: No acute fracture. No significant soft tissue swelling. No radiopaque foreign body. On the oblique image within the lateral soft tissues there is a questionable dense focus measuring 3 mm. No significant degenerative changes. IMPRESSION: No acute fracture. The oblique images demonstrate a questionable linear density in the region of the injury to the lateral left foot. This could potentially represent a retained glass foreign body. If there is further concern, ultrasound study of this region may be considered. Signed, Yvone NeuJaime S. Loreta AveWagner, DO [facility] Electronically Signed   By: Gilmer MorJaime  Wagner D.O.   On: 08/18/2015 08:32   I have personally reviewed and evaluated these images as part of my medical decision-making.   EKG Interpretation None      MDM   Final diagnoses:  Foreign body (FB) in soft tissue  Hand injury, left, initial encounter  Foot injury, left, initial encounter    [name] presents with left hand and left foot injuries that occurred yesterday.  Hand x-ray negative for abnormalities. Ultrasound ordered due to radiologist's recommendation and the suspicion for retained foreign object. No foreign body on ultrasound. Postop shoe and Velcro wrist splint given for comfort. Home care instructions, including wound management, as well as return precautions discussed. Patient voiced understanding of these instructions and is comfortable with discharge.   Filed Vitals:   08/18/15 0758 08/18/15 1006  BP: 136/82 133/81  Pulse: 93 79  Temp: 99.6 F (37.6 C) 98.6 F (37 C)  TempSrc: Oral Oral  Resp: 16 16  Height: 5\' 6"  (1.676 m)   Weight: 65.772 kg   SpO2: 100% 100%     Anselm PancoastShawn C Haydyn Liddell, PA-C 08/18/15 1012  Azalia BilisKevin Campos, MD 08/19/15 [phone]

## 2015-08-18 NOTE — ED Notes (Signed)
Was moving furniture yesterday-- glass table was falling, tried to catch it, injured left wrist-- stepped on a piece of glass-- left foot. No drainage noted, left hand swollen

## 2015-08-18 NOTE — Discharge Instructions (Signed)
You have been seen today for a foot and a hand injury. Your imaging showed no abnormalities, including no foreign bodies left under the skin. This makes it more unlikely that there are foreign bodies under the skin, but not impossible. There is not enough evidence at this time to cut into your foot. The area should heal over the next 1-2 weeks. Keep the wound clean and dry. Referred to the attached literature for further home care instructions. Apply ice to the hand and foot for no more than 15 minutes at a time. Follow up with PCP as needed. Return to ED should symptoms worsen or signs of infection arise.  Expect your soreness to increase over the next 2-3 days. Take 500 mg of naproxen every 12 hours or 800 mg of ibuprofen every 8 hours for the next 3 days. Take these medications with food to avoid upset stomach.

## 2016-10-11 ENCOUNTER — Emergency Department (HOSPITAL_COMMUNITY)
Admission: EM | Admit: 2016-10-11 | Discharge: 2016-10-11 | Disposition: A | Discharge status: Home / Self Care | Payer: Medicaid Other | Attending: Emergency Medicine | Admitting: Emergency Medicine

## 2016-10-11 ENCOUNTER — Encounter (HOSPITAL_COMMUNITY): Payer: Self-pay

## 2016-10-11 DIAGNOSIS — Y9389 Activity, other specified: Secondary | ICD-10-CM | POA: Insufficient documentation

## 2016-10-11 DIAGNOSIS — Y92832 Beach as the place of occurrence of the external cause: Secondary | ICD-10-CM | POA: Insufficient documentation

## 2016-10-11 DIAGNOSIS — Y999 Unspecified external cause status: Secondary | ICD-10-CM | POA: Insufficient documentation

## 2016-10-11 DIAGNOSIS — L539 Erythematous condition, unspecified: Secondary | ICD-10-CM | POA: Insufficient documentation

## 2016-10-11 DIAGNOSIS — F17210 Nicotine dependence, cigarettes, uncomplicated: Secondary | ICD-10-CM | POA: Insufficient documentation

## 2016-10-11 DIAGNOSIS — Z79899 Other long term (current) drug therapy: Secondary | ICD-10-CM | POA: Insufficient documentation

## 2016-10-11 DIAGNOSIS — W57XXXA Bitten or stung by nonvenomous insect and other nonvenomous arthropods, initial encounter: Secondary | ICD-10-CM | POA: Insufficient documentation

## 2016-10-11 MED ORDER — DIPHENHYDRAMINE HCL 25 MG PO CAPS: 25.0000 mg | ORAL_CAPSULE | Freq: Four times a day (QID) | ORAL | 0 refills | Status: AC | PRN

## 2016-10-11 MED ORDER — LORATADINE 10 MG PO TABS
10.0000 mg | ORAL_TABLET | Freq: Once | ORAL | Status: AC
  Administered 2016-10-11: 10 mg via ORAL
  Filled 2016-10-11: qty 1

## 2016-10-11 NOTE — Discharge Instructions (Signed)
As discussed, you may use benadryl as needed for itching and ice to help with symptoms.   Follow up with a primary care provider as needed.  Return to be seen if you experience fever, chills, nausea, vomiting, diarrhea, rash, bad headache, dizziness or any other new concerning symptoms in the meantime.

## 2016-10-11 NOTE — ED Triage Notes (Signed)
Patient here requesting evaluation of an insect bit to left forearm and fingers.  Size of a dime and red.  No oozing noted.  Patient does not know the bug type, [place].

## 2016-10-11 NOTE — ED Provider Notes (Signed)
[facility]-EMER[facility]ENCY DEPT Provider Note   CSN: [id number] Arrival date & time: 10/11/16  1645  By signing my name below, I, Diona Browner, attest that this documentation has been prepared under the direction and in the presence of Mathews Robinsons, PA-C. Electronically Signed: Diona Browner, ED Scribe. 10/11/16. 6:04 PM.  History   Chief Complaint Chief Complaint  Patient presents with  . Insect Bite    HPI [name] is a 31 y.o. female who presents to the Emergency Department complaining of gradually improving insect bites that was noticed yesterday, 10/10/16. Pt reports she was out of town at [employer] when she noticed multiple bites to her left forearm, left hand and right hand. Pt reports they are similar to past mosquito bites, but states she had never had bites on her fingers before. Didn't take anything at home to alleviate her discomfort. No hx of asthma. She would also like a work note. Pt denies fever, chills, nausea, vomiting, rash, or any other complaints at this time.   The history is provided by the patient. No language interpreter was used.    History reviewed. No pertinent past medical history.  Patient Active Problem List   Diagnosis Date Noted  . Normal delivery 10/02/2013  . Indication for care in labor or delivery 10/01/2013    Past Surgical History:  Procedure Laterality Date  . MANDIBLE FRACTURE SURGERY      OB History    Gravida Para Term Preterm AB Living   5 3 3   1 3    SAB TAB Ectopic Multiple Live Births   0 1     3       Home Medications    Prior to Admission medications   Medication Sig Start Date End Date Taking? Authorizing Provider  diphenhydrAMINE (BENADRYL) 25 mg capsule Take 1 capsule (25 mg total) by mouth every 6 (six) hours as needed. 10/11/16   Georgiana Shore, PA-C  ibuprofen (ADVIL,MOTRIN) 800 MG tablet Take 1 tablet (800 mg total) by mouth 3 (three) times daily. 08/18/15   Joy, Shawn C, PA-C  naproxen (NAPROSYN) 500 MG  tablet Take 1 tablet (500 mg total) by mouth 2 (two) times daily. 08/18/15   Joy, Hillard Danker, PA-C    Family History History reviewed. No pertinent family history.  Social History Social History  Substance Use Topics  . Smoking status: Current Every Day Smoker    Packs/day: 0.50    Types: Cigarettes  . Smokeless tobacco: Never Used  . Alcohol use Yes     Comment: occasional     Allergies   Patient has no known allergies.   Review of Systems Review of Systems  Constitutional: Negative for chills and fever.  HENT: Negative for facial swelling, sore throat and trouble swallowing.   Respiratory: Negative for cough, choking, chest tightness, shortness of breath, wheezing and stridor.   Cardiovascular: Negative for chest pain and palpitations.  Gastrointestinal: Negative for abdominal distention, abdominal pain, nausea and vomiting.  Musculoskeletal: Negative for arthralgias, back pain, gait problem, joint swelling, myalgias, neck pain and neck stiffness.  Skin: Positive for color change. Negative for pallor and rash.       Insect bites  Neurological: Negative for dizziness, facial asymmetry, weakness, light-headedness, numbness and headaches.     Physical Exam Updated Vital Signs BP (!) 148/94   Pulse 70   Temp 98.3 F (36.8 C) (Oral)   Resp 16   Ht 5\' 4"  (1.626 m)   Wt 65.8 kg (  145 lb)   SpO2 100%   BMI 24.89 kg/m   Physical Exam  Constitutional: She appears well-developed and well-nourished. No distress.  Patient is afebrile, non-toxic appearing, sitting comfortably in chair in no acute distress.  HENT:  Head: Normocephalic and atraumatic.  Eyes: Conjunctivae and EOM are normal.  Neck: Normal range of motion. Neck supple.  Cardiovascular: Normal rate, regular rhythm, normal heart sounds and intact distal pulses.   Pulmonary/Chest: Effort normal and breath sounds normal. No respiratory distress. She has no wheezes. She has no rales.  Abdominal: She exhibits no  distension.  Musculoskeletal: Normal range of motion. She exhibits no edema, tenderness or deformity.  Two bites on left wrist. One on her right index finger. FROM. No swelling in the joints.   Neurological: She is alert. No sensory deficit.  Skin: Skin is warm and dry. No rash noted. She is not diaphoretic. There is erythema. No pallor.  Mild erythema surrounding bite areas from patient scratching.  Psychiatric: She has a normal mood and affect. Her behavior is normal.  Nursing note and vitals reviewed.    ED Treatments / Results  DIAGNOSTIC STUDIES: Oxygen Saturation is 100% on RA, normal by my interpretation.   COORDINATION OF CARE: 6:04 PM-Discussed next steps with pt which includes taking benadryl and using ice. Pt verbalized understanding and is agreeable with the plan.   Labs (all labs ordered are listed, but only abnormal results are displayed) Labs Reviewed - No data to display  EKG  EKG Interpretation None       Radiology No results found.  Procedures Procedures (including critical care time)  Medications Ordered in ED Medications  loratadine (CLARITIN) tablet 10 mg (10 mg Oral Given 10/11/16 1819)     Initial Impression / Assessment and Plan / ED Course  I have reviewed the triage vital signs and the nursing notes.  Pertinent labs & imaging results that were available during my care of the patient were reviewed by me and considered in my medical decision making (see chart for details).     Patient presenting with multiple pruritic insect bites on her wrist and fingers that have been improving since onset. She reports she typically gets swelling with mosquito bites but is unsure what has bitten her at this time and she's never had bites to her hands in the past.  No rash, swelling, systemic symptoms, patient is well-appearing nontoxic and afebrile.  Discharge home with symptomatic relief and follow up with PCP as needed.  Discussed strict return  precautions and advised to return to the emergency department if experiencing any new or worsening symptoms. Instructions were understood and patient agreed with discharge plan.  Final Clinical Impressions(s) / ED Diagnoses   Final diagnoses:  Insect bite, initial encounter    New Prescriptions Discharge Medication List as of 10/11/2016  6:09 PM    START taking these medications   Details  diphenhydrAMINE (BENADRYL) 25 mg capsule Take 1 capsule (25 mg total) by mouth every 6 (six) hours as needed., Starting Mon 10/11/2016, Print       I personally performed the services described in this documentation, which was scribed in my presence. The recorded information has been reviewed and is accurate.     Georgiana ShoreMitchell, Sumeya Yontz B, PA-C 10/11/16 1901    Geoffery Lyonselo, Douglas, MD 10/11/16 [phone]

## 2016-11-03 ENCOUNTER — Emergency Department (HOSPITAL_COMMUNITY)
Admission: EM | Admit: 2016-11-03 | Discharge: 2016-11-03 | Disposition: A | Discharge status: Home / Self Care | Payer: Self-pay | Attending: Emergency Medicine | Admitting: Emergency Medicine

## 2016-11-03 ENCOUNTER — Encounter (HOSPITAL_COMMUNITY): Payer: Self-pay | Admitting: *Deleted

## 2016-11-03 DIAGNOSIS — R350 Frequency of micturition: Secondary | ICD-10-CM | POA: Insufficient documentation

## 2016-11-03 DIAGNOSIS — M79605 Pain in left leg: Secondary | ICD-10-CM | POA: Insufficient documentation

## 2016-11-03 DIAGNOSIS — M79604 Pain in right leg: Secondary | ICD-10-CM | POA: Insufficient documentation

## 2016-11-03 DIAGNOSIS — Z79899 Other long term (current) drug therapy: Secondary | ICD-10-CM | POA: Insufficient documentation

## 2016-11-03 LAB — URINALYSIS, ROUTINE W REFLEX MICROSCOPIC
Bilirubin Urine: NEGATIVE
GLUCOSE, UA: NEGATIVE mg/dL
HGB URINE DIPSTICK: NEGATIVE
Ketones, ur: NEGATIVE mg/dL
Leukocytes, UA: NEGATIVE
Nitrite: NEGATIVE
PH: 7 (ref 5.0–8.0)
Protein, ur: NEGATIVE mg/dL
Specific Gravity, Urine: 1.004 — ABNORMAL LOW (ref 1.005–1.030)

## 2016-11-03 LAB — I-STAT CHEM 8, ED
BUN: 8 mg/dL (ref 6–20)
CALCIUM ION: 1.12 mmol/L — AB (ref 1.15–1.40)
CHLORIDE: 106 mmol/L (ref 101–111)
Creatinine, Ser: 0.7 mg/dL (ref 0.44–1.00)
Glucose, Bld: 84 mg/dL (ref 65–99)
HEMATOCRIT: 37 % (ref 36.0–46.0)
HEMOGLOBIN: 12.6 g/dL (ref 12.0–15.0)
POTASSIUM: 3.9 mmol/L (ref 3.5–5.1)
SODIUM: 140 mmol/L (ref 135–145)
TCO2: 25 mmol/L (ref 22–32)

## 2016-11-03 LAB — I-STAT BETA HCG BLOOD, ED (MC, WL, AP ONLY): I-stat hCG, quantitative: <5 m[IU]/mL (ref <5)

## 2016-11-03 MED ORDER — NAPROXEN 250 MG PO TABS: 250.0000 mg | ORAL_TABLET | Freq: Two times a day (BID) | ORAL | 0 refills | Status: DC

## 2016-11-03 NOTE — ED Provider Notes (Signed)
[facility] Provider Note   CSN: [id number] Arrival date & time: 11/03/16  0847     History   Chief Complaint Chief Complaint  Patient presents with  . Leg Pain  . Urinary Frequency    HPI [name] is a 31 y.o. female.  [name] is a 31 y.o. Female who presents to the emergency department complaining of urinary frequency for 3 months and 2 days of cramping and pain to her bilateral legs. Patient reports she's been having urinary frequency for about the past 3 months. She denies other urinary symptoms. She denies dysuria, hematuria or urgency. She denies vaginal bleeding or discharge. She also reports for the past 2 days she's been having pain and cramping to her bilateral calves. She denies any leg swelling. She denies any injury or trauma to her legs. She reports she stands frequently and walks a lot for work. LMP 10/30/16. No treatments attempted prior to arrival. She denies fevers, abdominal pain, vaginal bleeding, vaginal discharge, back pain, leg swelling, chest pain, shortness of breath, dysuria, hematuria, urinary frequency or rashes.   The history is provided by the patient and medical records. No language interpreter was used.  Leg Pain   Pertinent negatives include no numbness.  Urinary Frequency  Pertinent negatives include no chest pain, no abdominal pain, no headaches and no shortness of breath.    History reviewed. No pertinent past medical history.  Patient Active Problem List   Diagnosis Date Noted  . Normal delivery 10/02/2013  . Indication for care in labor or delivery 10/01/2013    Past Surgical History:  Procedure Laterality Date  . MANDIBLE FRACTURE SURGERY      OB History    Gravida Para Term Preterm AB Living   5 3 3   1 3    SAB TAB Ectopic Multiple Live Births   0 1     3       Home Medications    Prior to Admission medications   Medication Sig Start Date End Date Taking? Authorizing Provider  diphenhydrAMINE (BENADRYL)  25 mg capsule Take 1 capsule (25 mg total) by mouth every 6 (six) hours as needed. 10/11/16  Yes Mathews RobinsonsMitchell, Jessica B, PA-C  naproxen (NAPROSYN) 250 MG tablet Take 1 tablet (250 mg total) by mouth 2 (two) times daily with a meal. 11/03/16   Everlene Farrieransie, Teanna Elem, PA-C    Family History History reviewed. No pertinent family history.  Social History Social History  Substance Use Topics  . Smoking status: Current Every Day Smoker    Packs/day: 0.50    Types: Cigarettes  . Smokeless tobacco: Never Used  . Alcohol use Yes     Comment: occasional     Allergies   Patient has no known allergies.   Review of Systems Review of Systems  Constitutional: Negative for chills and fever.  HENT: Negative for congestion and sore throat.   Eyes: Negative for visual disturbance.  Respiratory: Negative for cough and shortness of breath.   Cardiovascular: Negative for chest pain and leg swelling.  Gastrointestinal: Negative for abdominal pain, diarrhea, nausea and vomiting.  Genitourinary: Positive for frequency. Negative for decreased urine volume, difficulty urinating, dysuria, flank pain, genital sores, hematuria, menstrual problem, pelvic pain, urgency, vaginal bleeding, vaginal discharge and vaginal pain.  Musculoskeletal: Positive for arthralgias. Negative for back pain, joint swelling and neck pain.  Skin: Negative for rash.  Neurological: Negative for weakness, numbness and headaches.     Physical Exam Updated Vital Signs  BP (!) 146/107   Pulse 71   Temp 98 F (36.7 C) (Oral)   Resp 16   Ht 5\' 6"  (1.676 m)   Wt 65.8 kg (145 lb)   LMP 10/30/2016   SpO2 100%   BMI 23.40 kg/m   Physical Exam  Constitutional: She appears well-developed and well-nourished. No distress.  Nontoxic-appearing.  HENT:  Head: Normocephalic and atraumatic.  Mouth/Throat: Oropharynx is clear and moist.  Eyes: Pupils are equal, round, and reactive to light. Conjunctivae and EOM are normal. Right eye exhibits no  discharge. Left eye exhibits no discharge.  Neck: Normal range of motion. Neck supple.  Cardiovascular: Normal rate, regular rhythm, normal heart sounds and intact distal pulses.  Exam reveals no gallop and no friction rub.   No murmur heard. Bilateral radial, posterior tibialis and dorsalis pedis pulses are intact.    Pulmonary/Chest: Effort normal and breath sounds normal. No respiratory distress. She has no wheezes. She has no rales.  Abdominal: Soft. Bowel sounds are normal. She exhibits no distension. There is no tenderness. There is no guarding.  Abdomen is soft and nontender to palpation.  Musculoskeletal: Normal range of motion. She exhibits no edema, tenderness or deformity.  No lower extremity edema or tenderness. No calf tenderness bilaterally. Good capillary refill to bilateral toes. Bilateral dorsalis pedis and posterior tibialis pulses are intact.  Lymphadenopathy:    She has no cervical adenopathy.  Neurological: She is alert. No sensory deficit. Coordination normal.  Skin: Skin is warm and dry. Capillary refill takes less than 2 seconds. No rash noted. She is not diaphoretic. No erythema. No pallor.  Psychiatric: She has a normal mood and affect. Her behavior is normal.  Nursing note and vitals reviewed.    ED Treatments / Results  Labs (all labs ordered are listed, but only abnormal results are displayed) Labs Reviewed  URINALYSIS, ROUTINE W REFLEX MICROSCOPIC - Abnormal; Notable for the following:       Result Value   Color, Urine STRAW (*)    Specific Gravity, Urine 1.004 (*)    All other components within normal limits  I-STAT CHEM 8, ED - Abnormal; Notable for the following:    Calcium, Ion 1.12 (*)    All other components within normal limits  I-STAT BETA HCG BLOOD, ED ([facility], [facility], [facility] ONLY)    EKG  EKG Interpretation None       Radiology No results found.  Procedures Procedures (including critical care time)  Medications Ordered in ED Medications - No  data to display   Initial Impression / Assessment and Plan / ED Course  I have reviewed the triage vital signs and the nursing notes.  Pertinent labs & imaging results that were available during my care of the patient were reviewed by me and considered in my medical decision making (see chart for details).    This is a 31 y.o. Female who presents to the emergency department complaining of urinary frequency for 3 months and 2 days of cramping and pain to her bilateral legs. Patient reports she's been having urinary frequency for about the past 3 months. She denies other urinary symptoms. She denies dysuria, hematuria or urgency. She denies vaginal bleeding or discharge. She also reports for the past 2 days she's been having pain and cramping to her bilateral calves. She denies any leg swelling. She denies any injury or trauma to her legs. She reports she stands frequently and walks a lot for work. LMP 10/30/16. On examination patient  is afebrile nontoxic-appearing. Her abdomen is soft and nontender to palpation. She is no lower extremity edema or tenderness. She is neurovascularly intact. Mouth is shows no sign of infection. No hematuria or glucosuria. Pregnancy test is negative. I-STAT Chem-8 is unremarkable. Normal electrolytes. Normal kidney function. Normal blood sugar. Patient has been having ongoing urinary frequency for 3 months. I'm unsure as to this cause and encouraged her to follow-up with her OB/GYN. She denies any symptoms of STD. Blood work is reassuring. Will discharge with prescription for naproxen and encouraged follow-up with OB/GYN and primary care. Return precautions discussed. I advised the patient to follow-up with their primary care provider this week. I advised the patient to return to the emergency department with new or worsening symptoms or new concerns. The patient verbalized understanding and agreement with plan.     Final Clinical Impressions(s) / ED Diagnoses   Final  diagnoses:  Urinary frequency  Pain in both lower extremities    New Prescriptions New Prescriptions   NAPROXEN (NAPROSYN) 250 MG TABLET    Take 1 tablet (250 mg total) by mouth 2 (two) times daily with a meal.     Everlene Farrier, PA-C 11/03/16 1233    Rolland Porter, MD 11/14/16 [phone]

## 2016-11-03 NOTE — ED Triage Notes (Signed)
Pt reports bilateral lower leg pain and cramping over past two days. Having frequent urination and thinks she has UTI. No acute distress is noted at this time.

## 2017-02-14 ENCOUNTER — Ambulatory Visit: Payer: Self-pay | Admitting: Obstetrics and Gynecology

## 2017-03-03 ENCOUNTER — Ambulatory Visit: Payer: Self-pay | Admitting: Obstetrics and Gynecology

## 2017-08-18 ENCOUNTER — Other Ambulatory Visit (HOSPITAL_COMMUNITY)
Admission: RE | Admit: 2017-08-18 | Discharge: 2017-08-18 | Disposition: A | Discharge status: Home / Self Care | Payer: Medicaid Other | Source: Ambulatory Visit | Attending: Certified Nurse Midwife | Admitting: Certified Nurse Midwife

## 2017-08-18 ENCOUNTER — Encounter: Payer: Self-pay | Admitting: Certified Nurse Midwife

## 2017-08-18 ENCOUNTER — Ambulatory Visit: Payer: Medicaid Other | Admitting: Certified Nurse Midwife

## 2017-08-18 VITALS — BP 120/77 | HR 84 | Ht 64.0 in | Wt 150.2 lb

## 2017-08-18 DIAGNOSIS — Z0000 Encounter for general adult medical examination without abnormal findings: Secondary | ICD-10-CM | POA: Diagnosis not present

## 2017-08-18 DIAGNOSIS — Z3042 Encounter for surveillance of injectable contraceptive: Secondary | ICD-10-CM | POA: Diagnosis not present

## 2017-08-18 DIAGNOSIS — Z3202 Encounter for pregnancy test, result negative: Secondary | ICD-10-CM | POA: Diagnosis not present

## 2017-08-18 DIAGNOSIS — Z01419 Encounter for gynecological examination (general) (routine) without abnormal findings: Secondary | ICD-10-CM

## 2017-08-18 DIAGNOSIS — R399 Unspecified symptoms and signs involving the genitourinary system: Secondary | ICD-10-CM

## 2017-08-18 DIAGNOSIS — Z202 Contact with and (suspected) exposure to infections with a predominantly sexual mode of transmission: Secondary | ICD-10-CM

## 2017-08-18 LAB — POCT URINE PREGNANCY: Preg Test, Ur: NEGATIVE

## 2017-08-18 MED ORDER — SULFAMETHOXAZOLE-TRIMETHOPRIM 800-160 MG PO TABS: 1.0000 | ORAL_TABLET | Freq: Two times a day (BID) | ORAL | 0 refills | Status: AC

## 2017-08-18 MED ORDER — MEDROXYPROGESTERONE ACETATE 150 MG/ML IM SUSP: 150.0000 mg | Freq: Once | INTRAMUSCULAR | Status: AC

## 2017-08-18 MED ORDER — MEDROXYPROGESTERONE ACETATE 150 MG/ML IM SUSP
150.0000 mg | INTRAMUSCULAR | Status: AC
  Administered 2017-08-18: 150 mg via INTRAMUSCULAR

## 2017-08-18 NOTE — Progress Notes (Signed)
Patient is in the office for new GYN, pt states that she is [facility]. Patient complains of frequent urination with small amounts of urine coming out at times. Pt reports that she was + for gonorrhea, and finished antibiotics a week ago. Pt then stated that she has been having sex with same partner and does not think that he was treated.

## 2017-08-18 NOTE — Patient Instructions (Signed)

## 2017-08-19 LAB — CERVICOVAGINAL ANCILLARY ONLY
Bacterial vaginitis: POSITIVE — AB
Candida vaginitis: NEGATIVE
Chlamydia: NEGATIVE
Neisseria Gonorrhea: POSITIVE — AB
Trichomonas: NEGATIVE

## 2017-08-19 LAB — HEPATITIS B SURFACE ANTIGEN: Hepatitis B Surface Ag: NEGATIVE

## 2017-08-19 LAB — HEPATITIS C ANTIBODY: Hep C Virus Ab: <0.1 s/co ratio (ref 0.0–0.9)

## 2017-08-19 LAB — HIV ANTIBODY (ROUTINE TESTING W REFLEX): HIV Screen 4th Generation wRfx: NONREACTIVE

## 2017-08-19 LAB — RPR: RPR Ser Ql: NONREACTIVE

## 2017-08-20 LAB — URINE CULTURE: Organism ID, Bacteria: NO GROWTH

## 2017-08-20 NOTE — Progress Notes (Signed)
GYNECOLOGY ANNUAL PREVENTATIVE CARE ENCOUNTER NOTE  Subjective:   [name] is a 32 y.o. [phone] female here for a routine annual gynecologic exam.  Current complaints: frequent urination, request STD screening and request depo.   Denies abnormal vaginal bleeding, discharge, pelvic pain, problems with intercourse or other gynecologic concerns.    Gynecologic History Patient's last menstrual period was 07/13/2017. Contraception: none Last Pap: 2015. Results were: normal with negative HPV  Obstetric History OB History  Gravida Para Term Preterm AB Living  5 3 3   2 3   SAB TAB Ectopic Multiple Live Births  0 2     3    # Outcome Date GA Lbr Len/2nd Weight Sex Delivery Anes PTL Lv  5 Term 10/02/13 [redacted]w[redacted]d 02:18 / 00:25 8 lb 2 oz (3.685 kg) F Vag-Spont EPI  LIV  4 Term 04/29/10     Vag-Spont   LIV  3 Term 10/13/04     Vag-Spont   LIV  2 TAB           1 TAB             History reviewed. No pertinent past medical history.  Past Surgical History:  Procedure Laterality Date  . MANDIBLE FRACTURE SURGERY      Current Outpatient Medications on File Prior to Visit  Medication Sig Dispense Refill  . diphenhydrAMINE (BENADRYL) 25 mg capsule Take 1 capsule (25 mg total) by mouth every 6 (six) hours as needed. (Patient not taking: Reported on 08/18/2017) 30 capsule 0  . naproxen (NAPROSYN) 250 MG tablet Take 1 tablet (250 mg total) by mouth 2 (two) times daily with a meal. (Patient not taking: Reported on 08/18/2017) 30 tablet 0   No current facility-administered medications on file prior to visit.     No Known Allergies  Social History   Socioeconomic History  . Marital status: Single    Spouse name: Not on file  . Number of children: Not on file  . Years of education: Not on file  . Highest education level: Not on file  Occupational History  . Not on file  Social Needs  . Financial resource strain: Not on file  . Food insecurity:    Worry: Not on file    Inability:  Not on file  . Transportation needs:    Medical: Not on file    Non-medical: Not on file  Tobacco Use  . Smoking status: Current Every Day Smoker    Packs/day: 0.50    Types: Cigarettes  . Smokeless tobacco: Never Used  Substance and Sexual Activity  . Alcohol use: Yes    Comment: occasional  . Drug use: No  . Sexual activity: Yes    Birth control/protection: None, Condom  Lifestyle  . Physical activity:    Days per week: Not on file    Minutes per session: Not on file  . Stress: Not on file  Relationships  . Social connections:    Talks on phone: Not on file    Gets together: Not on file    Attends religious service: Not on file    Active member of club or organization: Not on file    Attends meetings of clubs or organizations: Not on file    Relationship status: Not on file  . Intimate partner violence:    Fear of current or ex partner: Not on file    Emotionally abused: Not on file    Physically abused: Not on file  Forced sexual activity: Not on file  Other Topics Concern  . Not on file  Social History Narrative  . Not on file    History reviewed. No pertinent family history.  The following portions of the patient's history were reviewed and updated as appropriate: allergies, current medications, past family history, past medical history, past social history, past surgical history and problem list.  Review of Systems Pertinent items noted in HPI and remainder of comprehensive ROS otherwise negative.   Objective:  BP 120/77   Pulse 84   Ht 5\' 4"  (1.626 m)   Wt 150 lb 3.2 oz (68.1 kg)   LMP 07/13/2017   BMI 25.78 kg/m  CONSTITUTIONAL: Well-developed, well-nourished female in no acute distress.  HENT:  Normocephalic, atraumatic, External right and left ear normal. Oropharynx is clear and moist EYES: Conjunctivae and EOM are normal. Pupils are equal, round, and reactive to light.  NECK: Normal range of motion, supple, no masses.  Normal thyroid.  SKIN: Skin  is warm and dry. No rash noted. Not diaphoretic. No erythema. No pallor. MUSCULOSKELETAL: Normal range of motion. No tenderness.  No cyanosis, clubbing, or edema.  2+ distal pulses. NEUROLOGIC: Alert and oriented to person, place, and time. Normal reflexes, muscle tone coordination. No cranial nerve deficit noted. PSYCHIATRIC: Normal mood and affect. Normal behavior. Normal judgment and thought content. CARDIOVASCULAR: Normal heart rate noted, regular rhythm RESPIRATORY: Clear to auscultation bilaterally. Effort and breath sounds normal, no problems with respiration noted. BREASTS: Symmetric in size. No masses, skin changes, nipple drainage, or lymphadenopathy. ABDOMEN: Soft, normal bowel sounds, no distention noted.  No tenderness, rebound or guarding.  PELVIC: Normal appearing external genitalia; normal appearing vaginal mucosa and cervix.  No abnormal discharge noted.  Pap smear obtained.  Normal uterine size, no other palpable masses, no uterine or adnexal tenderness. No rectal or bladder prolapse palpated.   Assessment and Plan:  1. Possible exposure to STD - Patient reports being positive for gonorrhea and treated but then had unprotected intercourse with same partner that was not treated.  -Educated and discussed to abstain from intercourse for 10-14 days after treatment. Patient request testing for all STD at this time.  - HIV antibody - RPR - Hepatitis B Surface AntiGEN - Hepatitis C Antibody - Cervicovaginal ancillary only  2. Encounter for annual routine gynecological examination -Normal annual examination including gynecology examination  - Cytology - PAP  3. Encounter for Depo-Provera contraception -Patient request Depo for birth control. Discussed need to return every 3 months for injection  - medroxyPROGESTERone (DEPO-PROVERA) injection 150 mg - POCT urine pregnancy  4. 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  - Urine Culture - sulfamethoxazole-trimethoprim (BACTRIM DS,SEPTRA DS) 800-160 MG tablet; Take 1 tablet by mouth 2 (two) times daily.  Dispense: 6 tablet; Refill: 0  Will follow up results of pap smear and manage accordingly. Routine preventative health maintenance measures emphasized. Please refer to After Visit Summary for other counseling recommendations.    Steward DroneVERONICA Tashiba Timoney, CNM [facility]

## 2017-08-22 ENCOUNTER — Telehealth: Payer: Self-pay

## 2017-08-22 NOTE — Telephone Encounter (Signed)
Returned call, no answer, left detailed vm (with patient's permission). Advised to call back to schedule appt for rocephin. HD form faxed.

## 2017-08-23 ENCOUNTER — Ambulatory Visit (INDEPENDENT_AMBULATORY_CARE_PROVIDER_SITE_OTHER): Payer: Medicaid Other | Admitting: *Deleted

## 2017-08-23 VITALS — BP 134/86 | HR 83 | Wt 146.0 lb

## 2017-08-23 DIAGNOSIS — A549 Gonococcal infection, unspecified: Secondary | ICD-10-CM | POA: Diagnosis not present

## 2017-08-23 MED ORDER — CEFTRIAXONE SODIUM 250 MG IJ SOLR
250.0000 mg | Freq: Once | INTRAMUSCULAR | Status: AC
  Administered 2017-08-23: 250 mg via INTRAMUSCULAR

## 2017-08-23 NOTE — Progress Notes (Signed)
I have reviewed the chart and agree with nursing staff's documentation of this patient's encounter.  Rachelle A Denney, CNM 08/23/2017 1:45 PM    

## 2017-08-23 NOTE — Progress Notes (Signed)
Pt is in office for Rocephin injection for +GC. Pt tolerated injection well. Pt advised of all STD recommendations and need for repeat testing.  Pt has no other concerns today.  BP 134/86   Pulse 83   Wt 146 lb (66.2 kg)   BMI 25.06 kg/m   Administrations This Visit    cefTRIAXone (ROCEPHIN) injection 250 mg    Admin Date 08/23/2017 Action Given Dose 250 mg Route Intramuscular Administered By Lanney GinsFoster, [place] Giammona D, CMA

## 2017-08-25 LAB — CYTOLOGY - PAP
Adequacy: ABSENT
Diagnosis: NEGATIVE
HPV: NOT DETECTED

## 2017-09-15 ENCOUNTER — Telehealth: Payer: Self-pay | Admitting: Certified Nurse Midwife

## 2017-09-20 ENCOUNTER — Ambulatory Visit: Payer: Self-pay | Admitting: Obstetrics & Gynecology

## 2017-10-20 ENCOUNTER — Ambulatory Visit: Payer: Medicaid Other | Admitting: Obstetrics

## 2017-10-20 ENCOUNTER — Encounter: Payer: Self-pay | Admitting: Obstetrics

## 2017-10-20 ENCOUNTER — Other Ambulatory Visit (HOSPITAL_COMMUNITY)
Admission: RE | Admit: 2017-10-20 | Discharge: 2017-10-20 | Disposition: A | Discharge status: Home / Self Care | Payer: Medicaid Other | Source: Ambulatory Visit | Attending: Obstetrics | Admitting: Obstetrics

## 2017-10-20 VITALS — BP 129/77 | HR 78 | Wt 144.9 lb

## 2017-10-20 DIAGNOSIS — R300 Dysuria: Secondary | ICD-10-CM

## 2017-10-20 DIAGNOSIS — N898 Other specified noninflammatory disorders of vagina: Secondary | ICD-10-CM | POA: Insufficient documentation

## 2017-10-20 DIAGNOSIS — R350 Frequency of micturition: Secondary | ICD-10-CM | POA: Diagnosis not present

## 2017-10-20 LAB — POCT URINALYSIS DIPSTICK
Bilirubin, UA: NEGATIVE
Glucose, UA: NEGATIVE
Ketones, UA: NEGATIVE
Leukocytes, UA: NEGATIVE
NITRITE UA: NEGATIVE
PROTEIN UA: POSITIVE — AB
Spec Grav, UA: 1.015 (ref 1.010–1.025)
Urobilinogen, UA: 0.2 E.U./dL
pH, UA: 6 (ref 5.0–8.0)

## 2017-10-20 NOTE — Progress Notes (Signed)
Patient ID: [name], female   DOB: [birth year], 32 y.o.   MRN: [id number]  Chief Complaint  Patient presents with  . TOC GC    HPI [name] is a 32 y.o. female.  History of urinary frequency and urinary retention.  Urine culture has been negative.  HPI  History reviewed. No pertinent past medical history.  Past Surgical History:  Procedure Laterality Date  . MANDIBLE FRACTURE SURGERY      History reviewed. No pertinent family history.  Social History Social History   Tobacco Use  . Smoking status: Current Every Day Smoker    Packs/day: 0.50    Types: Cigarettes  . Smokeless tobacco: Never Used  Substance Use Topics  . Alcohol use: Yes    Comment: occasional  . Drug use: No    No Known Allergies  Current Outpatient Medications  Medication Sig Dispense Refill  . diphenhydrAMINE (BENADRYL) 25 mg capsule Take 1 capsule (25 mg total) by mouth every 6 (six) hours as needed. (Patient not taking: Reported on 08/18/2017) 30 capsule 0  . naproxen (NAPROSYN) 250 MG tablet Take 1 tablet (250 mg total) by mouth 2 (two) times daily with a meal. (Patient not taking: Reported on 08/18/2017) 30 tablet 0  . sulfamethoxazole-trimethoprim (BACTRIM DS,SEPTRA DS) 800-160 MG tablet Take 1 tablet by mouth 2 (two) times daily. (Patient not taking: Reported on 10/20/2017) 6 tablet 0   Current Facility-Administered Medications  Medication Dose Route Frequency Provider Last Rate Last Dose  . medroxyPROGESTERone (DEPO-PROVERA) injection 150 mg  150 mg Intramuscular Once Sharyon Cableogers, Veronica C, CNM      . medroxyPROGESTERone (DEPO-PROVERA) injection 150 mg  150 mg Intramuscular Q90 days Sharyon CableRogers, Veronica C, CNM   150 mg at 08/18/17 1624    Review of Systems Review of Systems Constitutional: negative for fatigue and weight loss Respiratory: negative for cough and wheezing Cardiovascular: negative for chest pain, fatigue and palpitations Gastrointestinal: negative for abdominal pain and  change in bowel habits Genitourinary:POSITIVE for urinary frequency and urinary retention Integument/breast: negative for nipple discharge Musculoskeletal:negative for myalgias Neurological: negative for gait problems and tremors Behavioral/Psych: negative for abusive relationship, depression Endocrine: negative for temperature intolerance      Blood pressure 129/77, pulse 78, weight 144 lb 14.4 oz (65.7 kg), unknown if currently breastfeeding.  Physical Exam Physical Exam           General:  Alert and no distress Abdomen:  normal findings: no organomegaly, soft, non-tender and no hernia  Pelvis:  External genitalia: normal general appearance Urinary system: urethral meatus normal and bladder without fullness, nontender Vaginal: normal without tenderness, induration or masses Cervix: normal appearance Adnexa: normal bimanual exam Uterus: anteverted and non-tender, normal size    50% of 15 min visit spent on counseling and coordination of care.   Data Reviewed Wet Prep Cultures  Assessment     1. Frequent urination Rx: - POCT Urinalysis Dipstick - Ambulatory referral to Urology  2. Dysuria  3. Vaginal discharge Rx: - Cervicovaginal ancillary only    Plan    Follow up prn  Orders Placed This Encounter  Procedures  . Ambulatory referral to Urology    Referral Priority:   Routine    Referral Type:   Consultation    Referral Reason:   Specialty Services Required    Requested Specialty:   Urology    Number of Visits Requested:   1  . POCT Urinalysis Dipstick   No orders of the defined types were  placed in this encounter.    Brock Bad MD 10-20-2017

## 2017-10-20 NOTE — Progress Notes (Signed)
Pt presents for TOC GC. Pt c/o frequent urination and urinary retention. Pt Googled her sx's and suspects she has an overactive bladder.

## 2017-10-21 LAB — CERVICOVAGINAL ANCILLARY ONLY
BACTERIAL VAGINITIS: POSITIVE — AB
CANDIDA VAGINITIS: POSITIVE — AB
Chlamydia: NEGATIVE
Neisseria Gonorrhea: NEGATIVE
Trichomonas: NEGATIVE

## 2017-10-23 ENCOUNTER — Other Ambulatory Visit: Payer: Self-pay | Admitting: Obstetrics

## 2017-10-23 DIAGNOSIS — N760 Acute vaginitis: Principal | ICD-10-CM

## 2017-10-23 DIAGNOSIS — B9689 Other specified bacterial agents as the cause of diseases classified elsewhere: Secondary | ICD-10-CM

## 2017-10-23 DIAGNOSIS — B373 Candidiasis of vulva and vagina: Secondary | ICD-10-CM

## 2017-10-23 DIAGNOSIS — B3731 Acute candidiasis of vulva and vagina: Secondary | ICD-10-CM

## 2017-10-23 MED ORDER — FLUCONAZOLE 150 MG PO TABS: 150.0000 mg | ORAL_TABLET | Freq: Once | ORAL | 0 refills | Status: AC

## 2017-10-23 MED ORDER — SECNIDAZOLE 2 G PO PACK: 1.0000 | PACK | Freq: Once | ORAL | 2 refills | Status: AC

## 2017-10-27 ENCOUNTER — Telehealth: Payer: Self-pay

## 2017-10-27 ENCOUNTER — Other Ambulatory Visit: Payer: Self-pay | Admitting: Obstetrics

## 2017-10-27 DIAGNOSIS — B9689 Other specified bacterial agents as the cause of diseases classified elsewhere: Secondary | ICD-10-CM

## 2017-10-27 DIAGNOSIS — N760 Acute vaginitis: Principal | ICD-10-CM

## 2017-10-27 MED ORDER — TINIDAZOLE 500 MG PO TABS
1000.0000 mg | ORAL_TABLET | Freq: Every day | ORAL | 2 refills | Status: AC
Start: 2017-10-27 — End: ?

## 2017-10-27 NOTE — Telephone Encounter (Signed)
TC from pt stating she could not keep solosec down wants a pill Rx instead please advise.

## 2017-10-27 NOTE — Telephone Encounter (Signed)
Tindamax Rx for BV 

## 2017-11-07 ENCOUNTER — Ambulatory Visit: Payer: Medicaid Other

## 2018-01-10 ENCOUNTER — Ambulatory Visit (INDEPENDENT_AMBULATORY_CARE_PROVIDER_SITE_OTHER): Payer: Medicaid Other

## 2018-01-10 DIAGNOSIS — Z3042 Encounter for surveillance of injectable contraceptive: Secondary | ICD-10-CM | POA: Diagnosis not present

## 2018-01-10 DIAGNOSIS — Z01812 Encounter for preprocedural laboratory examination: Secondary | ICD-10-CM | POA: Diagnosis not present

## 2018-01-10 LAB — POCT URINE PREGNANCY: PREG TEST UR: NEGATIVE

## 2018-01-10 NOTE — Progress Notes (Signed)
Pt is in the office for 1st UPT for depo restart. UPT is negative Pt will return in 2 weeks for 2nd UPT and injection.

## 2018-01-11 NOTE — Progress Notes (Signed)
I have reviewed the chart and agree with nursing staff's documentation of this patient's encounter.  Hayley AntiguaPeggy Harleyquinn Gasser, MD 01/11/2018 10:02 AM

## 2018-03-13 ENCOUNTER — Ambulatory Visit: Payer: Medicaid Other | Admitting: Obstetrics

## 2018-03-21 ENCOUNTER — Other Ambulatory Visit: Payer: Self-pay

## 2018-03-21 ENCOUNTER — Emergency Department (HOSPITAL_COMMUNITY): Payer: Self-pay

## 2018-03-21 ENCOUNTER — Encounter (HOSPITAL_COMMUNITY): Payer: Self-pay | Admitting: Emergency Medicine

## 2018-03-21 ENCOUNTER — Emergency Department (HOSPITAL_COMMUNITY)
Admission: EM | Admit: 2018-03-21 | Discharge: 2018-03-21 | Disposition: A | Discharge status: Home / Self Care | Payer: Self-pay | Attending: Emergency Medicine | Admitting: Emergency Medicine

## 2018-03-21 DIAGNOSIS — B9689 Other specified bacterial agents as the cause of diseases classified elsewhere: Secondary | ICD-10-CM

## 2018-03-21 DIAGNOSIS — M25512 Pain in left shoulder: Secondary | ICD-10-CM | POA: Insufficient documentation

## 2018-03-21 DIAGNOSIS — N760 Acute vaginitis: Secondary | ICD-10-CM | POA: Insufficient documentation

## 2018-03-21 DIAGNOSIS — G8929 Other chronic pain: Secondary | ICD-10-CM

## 2018-03-21 DIAGNOSIS — Z113 Encounter for screening for infections with a predominantly sexual mode of transmission: Secondary | ICD-10-CM | POA: Insufficient documentation

## 2018-03-21 LAB — URINALYSIS, ROUTINE W REFLEX MICROSCOPIC
Bilirubin Urine: NEGATIVE
Glucose, UA: NEGATIVE mg/dL
Hgb urine dipstick: NEGATIVE
Ketones, ur: NEGATIVE mg/dL
LEUKOCYTES UA: NEGATIVE
NITRITE: NEGATIVE
PROTEIN: NEGATIVE mg/dL
SPECIFIC GRAVITY, URINE: 1.021 (ref 1.005–1.030)
pH: 8 (ref 5.0–8.0)

## 2018-03-21 LAB — WET PREP, GENITAL
SPERM: NONE SEEN
TRICH WET PREP: NONE SEEN
YEAST WET PREP: NONE SEEN

## 2018-03-21 MED ORDER — IBUPROFEN 800 MG PO TABS
800.0000 mg | ORAL_TABLET | Freq: Once | ORAL | Status: AC
  Administered 2018-03-21: 800 mg via ORAL
  Filled 2018-03-21: qty 1

## 2018-03-21 MED ORDER — METRONIDAZOLE 500 MG PO TABS: 500.0000 mg | ORAL_TABLET | Freq: Two times a day (BID) | ORAL | 0 refills | Status: AC

## 2018-03-21 MED ORDER — IBUPROFEN 600 MG PO TABS: 600.0000 mg | ORAL_TABLET | Freq: Four times a day (QID) | ORAL | 0 refills | Status: AC | PRN

## 2018-03-21 MED ORDER — AZITHROMYCIN 250 MG PO TABS
1000.0000 mg | ORAL_TABLET | Freq: Once | ORAL | Status: AC
  Administered 2018-03-21: 1000 mg via ORAL
  Filled 2018-03-21: qty 4

## 2018-03-21 MED ORDER — CEFTRIAXONE SODIUM 250 MG IJ SOLR
250.0000 mg | Freq: Once | INTRAMUSCULAR | Status: AC
  Administered 2018-03-21: 250 mg via INTRAMUSCULAR
  Filled 2018-03-21: qty 250

## 2018-03-21 MED ORDER — LIDOCAINE HCL (PF) 1 % IJ SOLN
INTRAMUSCULAR | Status: AC
  Administered 2018-03-21: 22:00:00
  Filled 2018-03-21: qty 5
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
## 2018-03-21 NOTE — ED Triage Notes (Signed)
Pt also c/o vaginal discharge with odor

## 2018-03-21 NOTE — Discharge Instructions (Signed)
You have been evaluated for your vaginal discharge.  You have been diagnosed with bacterial vaginosis.  Please take Flagyl as prescribed for the full duration.  Avoid drinking any alcohol while taking antibiotic as it can make you sick.  Follow-up with orthopedic for further evaluation of your chronic left shoulder pain.
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
## 2018-03-22 LAB — RPR: RPR Ser Ql: NONREACTIVE

## 2018-03-22 LAB — HIV ANTIBODY (ROUTINE TESTING W REFLEX): HIV Screen 4th Generation wRfx: NONREACTIVE

## 2018-03-23 LAB — GC/CHLAMYDIA PROBE AMP (~~LOC~~) NOT AT ARMC
CHLAMYDIA, DNA PROBE: NEGATIVE
Neisseria Gonorrhea: NEGATIVE

## 2018-04-19 NOTE — Telephone Encounter (Signed)
Error

## 2018-08-29 ENCOUNTER — Ambulatory Visit: Payer: Medicaid Other | Attending: Orthopaedic Surgery

## 2018-08-29 ENCOUNTER — Other Ambulatory Visit: Payer: Self-pay

## 2018-08-29 DIAGNOSIS — M25512 Pain in left shoulder: Secondary | ICD-10-CM | POA: Insufficient documentation

## 2018-08-29 DIAGNOSIS — R293 Abnormal posture: Secondary | ICD-10-CM | POA: Insufficient documentation

## 2018-08-29 DIAGNOSIS — R252 Cramp and spasm: Secondary | ICD-10-CM | POA: Insufficient documentation

## 2018-08-29 DIAGNOSIS — G8929 Other chronic pain: Secondary | ICD-10-CM | POA: Insufficient documentation
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
## 2018-09-05 ENCOUNTER — Ambulatory Visit: Payer: Medicaid Other | Attending: Orthopaedic Surgery

## 2018-09-05 ENCOUNTER — Telehealth: Payer: Self-pay | Admitting: Physical Therapy

## 2018-09-05 DIAGNOSIS — G8929 Other chronic pain: Secondary | ICD-10-CM | POA: Insufficient documentation

## 2018-09-05 DIAGNOSIS — R252 Cramp and spasm: Secondary | ICD-10-CM | POA: Insufficient documentation

## 2018-09-05 DIAGNOSIS — M25512 Pain in left shoulder: Secondary | ICD-10-CM | POA: Insufficient documentation

## 2018-09-05 DIAGNOSIS — R293 Abnormal posture: Secondary | ICD-10-CM | POA: Insufficient documentation

## 2018-09-05 NOTE — Telephone Encounter (Signed)
Mailbox not setup

## 2018-09-12 ENCOUNTER — Other Ambulatory Visit: Payer: Self-pay

## 2018-09-12 ENCOUNTER — Ambulatory Visit: Payer: Medicaid Other

## 2018-09-12 DIAGNOSIS — R293 Abnormal posture: Secondary | ICD-10-CM | POA: Diagnosis present

## 2018-09-12 DIAGNOSIS — R252 Cramp and spasm: Secondary | ICD-10-CM

## 2018-09-12 DIAGNOSIS — G8929 Other chronic pain: Secondary | ICD-10-CM

## 2018-09-12 DIAGNOSIS — M25512 Pain in left shoulder: Secondary | ICD-10-CM | POA: Diagnosis present

## 2018-09-12 NOTE — Therapy (Signed)
[facility] [address] [place], [place], [address] Phone: [phone]   Fax:  [phone]  Physical Therapy Treatment  Patient Details  Name: [name] S Louthan MRN: [id number] Date of Birth: 1986/01/06 Referring Provider (PT): Ramond Marrowax Varkey , MD   Encounter Date: 09/12/2018  PT End of Session - 09/12/18 1648    Visit Number  2    Number of Visits  12    Date for PT Re-Evaluation  10/13/18    Authorization Type  MCD    PT Start Time  0447   17 min late   PT Stop Time  0535    PT Time Calculation (min)  48 min    Activity Tolerance  Patient tolerated treatment well;Patient limited by lethargy    Behavior During Therapy  [facility]       No past medical history on file.  Past Surgical History:  Procedure Laterality Date  . MANDIBLE FRACTURE SURGERY      There were no vitals filed for this visit.  Subjective Assessment - 09/12/18 1651    Subjective  No better. I think its a rotator cuff problem.    Pain Score  8     Pain Location  Shoulder    Pain Orientation  Left;Anterior;Posterior;Lateral    Pain Descriptors / Indicators  --   excruciating   Pain Type  Chronic pain    Aggravating Factors   using arm    Pain Relieving Factors  heatr cream                       [facility] Adult PT Treatment/Exercise - 09/12/18 0001      Exercises   Exercises  Shoulder      Shoulder Exercises: Standing   Horizontal ABduction  Both;12 reps    External Rotation  Both;12 reps    Extension  Both;12 reps      Shoulder Exercises: ROM/Strengthening   UBE (Upper Arm Bike)  5 min L!      Shoulder Exercises: Stretch   Cross Chest Stretch  2 reps;20 seconds      Modalities   Modalities  Moist Heat;Electrical Stimulation      Moist Heat Therapy   Number Minutes Moist Heat  15 Minutes    Moist Heat Location  Shoulder   LT     Electrical Stimulation   Electrical Stimulation Location  Lt shoulder     Electrical Stimulation Action  IFC    Electrical Stimulation Parameters  L8 to tolerance    Electrical Stimulation Goals  Pain      Manual Therapy   Manual Therapy  Soft tissue mobilization    Soft tissue mobilization  to deltoid , periscapula muscles , trap and supraspinatus and pectorals               PT Short Term Goals - 08/29/18 1446      PT SHORT TERM GOAL #1   Title  She will be independent with initial hEp    Baseline  no program    Time  2    Period  Weeks    Status  New      PT SHORT TERM GOAL #2   Title  She will report pain improved 20% or more    Baseline  pain  /10 at eval    Time  2    Period  Weeks    Status  New  PT SHORT TERM GOAL #3   Title  Full active motion of LT arm    Baseline  unable to actively pull Lt arm up back from hips    Time  2    Period  Weeks    Status  New        PT Long Term Goals - 08/29/18 1447      PT LONG TERM GOAL #1   Title  She will be idpendent with all hEP issued    Baseline  indpendent with initial hEP    Time  6    Period  Weeks    Status  New      PT LONG TERM GOAL #2   Title  She will report pain decreased 50% or more with home and work tasks    Baseline  severe pain with all activity    Time  6    Period  Weeks    Status  New            Plan - 09/12/18 1652    Clinical Impression Statement  No improvement . Able to do the HEP . ROM Lt shoulder normal.  Tender but supple in muscles  of LT shoulder.   She likes the STW.,   Will  progress HEP an dcontinue manual and modalities and see if improves.    Will need 4-5 more sessions to see if  she will benefit from PT.    PT Treatment/Interventions  Dry needling;Manual techniques;Passive range of motion;Taping;Patient/family education;Therapeutic exercise;Therapeutic activities;Moist Heat;Cryotherapy;Iontophoresis 4mg /ml Dexamethasone;Ultrasound    PT Next Visit Plan  [place] for home  Manual and modalities.    PT Home Exercise Plan  scapula  retraction,, unattached band exer, rhomboid stretch ,  behind back stretch    Consulted and Agree with Plan of Care  Patient       Patient will benefit from skilled therapeutic intervention in order to improve the following deficits and impairments:  Pain, Impaired UE functional use, Decreased strength  Visit Diagnosis: 1. Chronic left shoulder pain   2. Cramp and spasm   3. Abnormal posture        Problem List Patient Active Problem List   Diagnosis Date Noted  . Normal delivery 10/02/2013  . Indication for care in labor or delivery 10/01/2013    Darrel Hoover  PT 09/12/2018, 5:24 PM  [place], [place], [address] Phone: [phone]   Fax:  [phone]  Name: JAIMEE CORUM MRN: [id number] Date of Birth: 05/23/1985

## 2018-09-14 ENCOUNTER — Ambulatory Visit: Payer: Medicaid Other

## 2018-09-14 ENCOUNTER — Other Ambulatory Visit: Payer: Self-pay

## 2018-09-14 DIAGNOSIS — G8929 Other chronic pain: Secondary | ICD-10-CM

## 2018-09-14 DIAGNOSIS — R293 Abnormal posture: Secondary | ICD-10-CM

## 2018-09-14 DIAGNOSIS — R252 Cramp and spasm: Secondary | ICD-10-CM

## 2018-09-14 DIAGNOSIS — M25512 Pain in left shoulder: Secondary | ICD-10-CM

## 2018-09-14 NOTE — Therapy (Signed)
[place], [place], [address] Phone: [phone]   Fax:  [phone]  Physical Therapy Treatment  Patient Details  Name: [name] MRN: [id number] Date of Birth: 10/07/1985 Referring Provider (PT): Ophelia Charter , MD   Encounter Date: 09/14/2018  PT End of Session - 09/14/18 1627    Visit Number  3    Number of Visits  12    Date for PT Re-Evaluation  10/13/18    Authorization Type  MCD    PT Start Time  0427   PT late   PT Stop Time  0510    PT Time Calculation (min)  43 min    Activity Tolerance  Patient tolerated treatment well;Patient limited by lethargy    Behavior During Therapy  [facility] for tasks assessed/performed       History reviewed. No pertinent past medical history.  Past Surgical History:  Procedure Laterality Date  . MANDIBLE FRACTURE SURGERY      There were no vitals filed for this visit.  Subjective Assessment - 09/14/18 1629    Subjective  OK after last session slept better and less pain  until next day but about the same now.  Did HEP.    Pain Score  8     Pain Location  Shoulder    Pain Orientation  Right    Pain Descriptors / Indicators  --   excruciating   Pain Type  Chronic pain    Pain Radiating Towards  to elbow    Aggravating Factors   using arm    Pain Relieving Factors  heat                       [facility] Adult PT Treatment/Exercise - 09/14/18 0001      Shoulder Exercises: Standing   Horizontal ABduction  Both;12 reps    Theraband Level (Shoulder Horizontal ABduction)  Level 2 (Red)    External Rotation  Both;12 reps    Theraband Level (Shoulder External Rotation)  Level 2 (Red)    Extension  Both;12 reps    Theraband Level (Shoulder Extension)  Level 2 (Red)    Row  Both;12 reps    Theraband Level (Shoulder Row)  Level 2 (Red)      Shoulder Exercises: ROM/Strengthening   UBE (Upper Arm Bike)  5 min L!      Moist Heat Therapy   Number  Minutes Moist Heat  15 Minutes    Moist Heat Location  Shoulder      Electrical Stimulation   Electrical Stimulation Location  Lt shoulder    Electrical Stimulation Action  IFC    Electrical Stimulation Parameters  L8      Manual Therapy   Soft tissue mobilization  to deltoid , periscapula muscles , trap and supraspinatus and pectorals             PT Education - 09/14/18 1709    Education Details  attached row and extension    Person(s) Educated  Patient    Methods  Explanation;Verbal cues;Handout    Comprehension  Verbalized understanding;Returned demonstration       PT Short Term Goals - 09/14/18 1645      PT SHORT TERM GOAL #1   Title  She will be independent with initial hEp    Status  Achieved      PT SHORT TERM GOAL #2   Title  She will report pain improved 20%  or more    Baseline  no change    Status  On-going      PT SHORT TERM GOAL #3   Title  Full active motion of LT arm    Baseline  Full if encouraged to go full ROM    Status  Achieved        PT Long Term Goals - 09/14/18 1646      PT LONG TERM GOAL #1   Title  She will be independent with all hEP issued    Baseline  indpendent with initial hEP    Time  6    Period  Weeks    Status  New      PT LONG TERM GOAL #2   Title  She will report pain decreased 50% or more with home and work tasks    Baseline  severe pain with all activity    Time  6    Period  Weeks    Status  New      PT LONG TERM GOAL #3   Title  She will report pain  occasionally to 1-3/10.    Baseline  7-8/10 generally    Time  6    Period  Weeks    Status  New      PT LONG TERM GOAL #4   Title  She will be able to tolerate green or blue band with HEP demo incr strength and decr pain.    Baseline  tolerating red band with severe pain reported    Time  6    Period  Weeks    Status  New            Plan - 09/14/18 1641    Clinical Impression Statement  No changes a she  did not come to first session and was late  for this session. She reported decreased pain after last session and able to sleep longer and more comfortable that  night.   She is able to do the HEP correctly  and with chronicity it is not likely she would have a significant changes.  after 2 session. Will need 6 more sessions to progress HEp and use manual to see if pain can be reduced.    PT Frequency  2x / week    PT Duration  3 weeks    PT Treatment/Interventions  Dry needling;Manual techniques;Passive range of motion;Taping;Patient/family education;Therapeutic exercise;Therapeutic activities;Moist Heat;Cryotherapy;Iontophoresis 4mg /ml Dexamethasone;Ultrasound    PT Next Visit Plan  [place] for  home and continue modalities and manual.    PT Home Exercise Plan  scapula retraction,, unattached band exer, rhomboid stretch ,  behind back stretch,  attached row and extension    Consulted and Agree with Plan of Care  Patient       Patient will benefit from skilled therapeutic intervention in order to improve the following deficits and impairments:  Pain, Impaired UE functional use, Decreased strength  Visit Diagnosis: 1. Abnormal posture   2. Cramp and spasm   3. Chronic left shoulder pain        Problem List Patient Active Problem List   Diagnosis Date Noted  . Normal delivery 10/02/2013  . Indication for care in labor or delivery 10/01/2013    Caprice RedChasse, [name]  PT 09/14/2018, 5:11 PM  [facility] [address] [place], [place], [address] Phone: [phone]   Fax:  [phone]  Name: [name] MRN: [id number] Date of Birth: September 20, 1985

## 2018-09-27 ENCOUNTER — Other Ambulatory Visit: Payer: Self-pay

## 2018-09-27 ENCOUNTER — Encounter: Payer: Self-pay | Admitting: Physical Therapy

## 2018-09-27 ENCOUNTER — Ambulatory Visit: Payer: Medicaid Other | Admitting: Physical Therapy

## 2018-09-27 DIAGNOSIS — R293 Abnormal posture: Secondary | ICD-10-CM | POA: Diagnosis not present

## 2018-09-27 DIAGNOSIS — G8929 Other chronic pain: Secondary | ICD-10-CM

## 2018-09-27 DIAGNOSIS — R252 Cramp and spasm: Secondary | ICD-10-CM

## 2018-09-27 NOTE — Patient Instructions (Signed)
Patient Given Rockwood red band 10-20 reps 2 times per day Also Given Scapular attached Row and Extension 20 reps 2 x per Day Red band with knot for door issued

## 2018-09-27 NOTE — Therapy (Signed)
[place], [place], [address] Phone: [phone]   Fax:  [phone]  Physical Therapy Treatment  Patient Details  Name: [name] MRN: [id number] Date of Birth: 1985-08-30 Referring Provider (PT): Ophelia Charter , MD   Encounter Date: 09/27/2018  PT End of Session - 09/27/18 1435    Visit Number  4    Number of Visits  12    Date for PT Re-Evaluation  10/13/18    Authorization Type  MCD    Authorization Time Period  awaiting auth    PT Start Time  0230    PT Stop Time  0313    PT Time Calculation (min)  43 min       History reviewed. No pertinent past medical history.  Past Surgical History:  Procedure Laterality Date  . MANDIBLE FRACTURE SURGERY      There were no vitals filed for this visit.  Subjective Assessment - 09/27/18 1434    Subjective  I see progress since I started PT. The pain is in a different place, back of shoulder.    Currently in Pain?  No/denies    Aggravating Factors   lay down, drive    Pain Relieving Factors  heat, change positions                       [facility] Adult PT Treatment/Exercise - 09/27/18 0001      Shoulder Exercises: Standing   Horizontal ABduction  20 reps    Theraband Level (Shoulder Horizontal ABduction)  Level 2 (Red)    External Rotation  20 reps    Theraband Level (Shoulder External Rotation)  Level 2 (Red)    Internal Rotation  20 reps    Theraband Level (Shoulder Internal Rotation)  Level 2 (Red)    Extension  20 reps    Theraband Level (Shoulder Extension)  Level 2 (Red)    Row  20 reps    Theraband Level (Shoulder Row)  Level 2 (Red)    Other Standing Exercises  wall push up x 10 ,cues for alignment       Shoulder Exercises: Pulleys   Flexion  2 minutes    Scaption  2 minutes      Shoulder Exercises: ROM/Strengthening   UBE (Upper Arm Bike)  4 min each way L 1      Manual Therapy   Manual therapy comments  passive ROM  all planes     Soft tissue mobilization  to deltoid , periscapula muscles , trap and supraspinatus and pectorals             PT Education - 09/27/18 1547    Education Details  HEP, red band    Person(s) Educated  Patient    Methods  Explanation;Handout    Comprehension  Verbalized understanding       PT Short Term Goals - 09/14/18 1645      PT SHORT TERM GOAL #1   Title  She will be independent with initial hEp    Status  Achieved      PT SHORT TERM GOAL #2   Title  She will report pain improved 20% or more    Baseline  no change    Status  On-going      PT SHORT TERM GOAL #3   Title  Full active motion of LT arm    Baseline  Full if encouraged to go full ROM  Status  Achieved        PT Long Term Goals - 09/14/18 1646      PT LONG TERM GOAL #1   Title  She will be independent with all hEP issued    Baseline  indpendent with initial hEP    Time  6    Period  Weeks    Status  New      PT LONG TERM GOAL #2   Title  She will report pain decreased 50% or more with home and work tasks    Baseline  severe pain with all activity    Time  6    Period  Weeks    Status  New      PT LONG TERM GOAL #3   Title  She will report pain  occasionally to 1-3/10.    Baseline  7-8/10 generally    Time  6    Period  Weeks    Status  New      PT LONG TERM GOAL #4   Title  She will be able to tolerate green or blue band with HEP demo incr strength and decr pain.    Baseline  tolerating red band with severe pain reported    Time  6    Period  Weeks    Status  New            Plan - 09/27/18 1436    Clinical Impression Statement  Pt reports improvement in pain with ADLS however most difficulty with laying, and driving. Continued with strengthening and manual. Updated HEP with scapular atteched (re-issued per pt request) and rockwood series. All tolerated well with fatigue. Declined Estim.    PT Next Visit Plan  review [place] for  home and continue modalities and  manual.    PT Home Exercise Plan  scapula retraction,, unattached band exer, rhomboid stretch ,  behind back stretch,  attached row and extension, rockwood series    Consulted and Agree with Plan of Care  Patient       Patient will benefit from skilled therapeutic intervention in order to improve the following deficits and impairments:  Pain, Impaired UE functional use, Decreased strength  Visit Diagnosis: 1. Abnormal posture   2. Cramp and spasm   3. Chronic left shoulder pain        Problem List Patient Active Problem List   Diagnosis Date Noted  . Normal delivery 10/02/2013  . Indication for care in labor or delivery 10/01/2013    Sherrie MustacheDonoho, Zade Falkner McGee, PTA 09/27/2018, 3:50 PM  [facility] [address] [place], [place], [address] Phone: [phone]   Fax:  [phone]  Name: [name] MRN: [id number] Date of Birth: 1985-04-22

## 2018-10-02 ENCOUNTER — Ambulatory Visit: Payer: Medicaid Other

## 2018-10-03 ENCOUNTER — Other Ambulatory Visit: Payer: Self-pay

## 2018-10-03 ENCOUNTER — Ambulatory Visit: Payer: Medicaid Other | Attending: Orthopaedic Surgery

## 2018-10-03 DIAGNOSIS — M25512 Pain in left shoulder: Secondary | ICD-10-CM | POA: Insufficient documentation

## 2018-10-03 DIAGNOSIS — R252 Cramp and spasm: Secondary | ICD-10-CM | POA: Diagnosis present

## 2018-10-03 DIAGNOSIS — R293 Abnormal posture: Secondary | ICD-10-CM | POA: Diagnosis present

## 2018-10-03 DIAGNOSIS — G8929 Other chronic pain: Secondary | ICD-10-CM | POA: Insufficient documentation

## 2018-10-03 NOTE — Therapy (Signed)
[facility] [address] [place], [place], [address] Phone: [phone]   Fax:  [phone]  Physical Therapy Treatment  Patient Details  Name: [name] MRN: [id number] Date of Birth: 1985-11-08 Referring Provider (PT): Ramond Marrowax Varkey , MD   Encounter Date: 10/03/2018  PT End of Session - 10/03/18 1107    Visit Number  5    Number of Visits  12    Date for PT Re-Evaluation  10/13/18    Authorization Type  MCD    Authorization Time Period  7/29-8/18/2020    PT Start Time  1130    PT Stop Time  1222    PT Time Calculation (min)  52 min    Activity Tolerance  Patient tolerated treatment well    Behavior During Therapy  [facility] for tasks assessed/performed       History reviewed. No pertinent past medical history.  Past Surgical History:  Procedure Laterality Date  . MANDIBLE FRACTURE SURGERY      There were no vitals filed for this visit.  Subjective Assessment - 10/03/18 1108    Subjective  Still hurting but i am better    Pain Score  5     Pain Location  Neck   shoulder  side   Pain Orientation  Left    Pain Type  Chronic pain    Aggravating Factors   drivew , lying    Pain Relieving Factors  heat                       [facility] Adult PT Treatment/Exercise - 10/03/18 0001      Shoulder Exercises: Standing   Horizontal ABduction  20 reps    Theraband Level (Shoulder Horizontal ABduction)  Level 2 (Red)    External Rotation  20 reps    Theraband Level (Shoulder External Rotation)  Level 2 (Red)    Internal Rotation  20 reps    Theraband Level (Shoulder Internal Rotation)  Level 2 (Red);Level 4 (Blue)    Extension  20 reps    Theraband Level (Shoulder Extension)  Level 2 (Red);Level 4 (Blue)    Row  20 reps    Theraband Level (Shoulder Row)  Level 2 (Red);Level 4 (Blue)    Other Standing Exercises  wall push up x 20      Shoulder Exercises: ROM/Strengthening   UBE (Upper Arm Bike)  L2  6 min       Manual Therapy   Manual therapy comments  --    Soft tissue mobilization  to deltoid , periscapula muscles , trap and supraspinatus and pectorals      MHP  Lt shoulder 10 min post         PT Short Term Goals - 09/14/18 1645      PT SHORT TERM GOAL #1   Title  She will be independent with initial hEp    Status  Achieved      PT SHORT TERM GOAL #2   Title  She will report pain improved 20% or more    Baseline  no change    Status  On-going      PT SHORT TERM GOAL #3   Title  Full active motion of LT arm    Baseline  Full if encouraged to go full ROM    Status  Achieved        PT Long Term Goals - 09/14/18 1646  PT LONG TERM GOAL #1   Title  She will be independent with all hEP issued    Baseline  indpendent with initial hEP    Time  6    Period  Weeks    Status  New      PT LONG TERM GOAL #2   Title  She will report pain decreased 50% or more with home and work tasks    Baseline  severe pain with all activity    Time  6    Period  Weeks    Status  New      PT LONG TERM GOAL #3   Title  She will report pain  occasionally to 1-3/10.    Baseline  7-8/10 generally    Time  6    Period  Weeks    Status  New      PT LONG TERM GOAL #4   Title  She will be able to tolerate green or blue band with HEP demo incr strength and decr pain.    Baseline  tolerating red band with severe pain reported    Time  6    Period  Weeks    Status  New            Plan - 10/03/18 1108    Clinical Impression Statement  Pain level less than at eval. Tissue of LT shoulder and spine are supple.   ROM normal. Able to exercise with Blue band (issued) without incr pain    PT Treatment/Interventions  Dry needling;Manual techniques;Passive range of motion;Taping;Patient/family education;Therapeutic exercise;Therapeutic activities;Moist Heat;Cryotherapy;Iontophoresis 4mg /ml Dexamethasone;Ultrasound    PT Next Visit Plan  Continue exercise with incr load . Trial machines .    Cont heat and STW    PT Home Exercise Plan  scapula retraction,, unattached band exer, rhomboid stretch ,  behind back stretch,  attached row and extension, rockwood series    Consulted and Agree with Plan of Care  Patient       Patient will benefit from skilled therapeutic intervention in order to improve the following deficits and impairments:  Pain, Impaired UE functional use, Decreased strength  Visit Diagnosis: 1. Cramp and spasm   2. Chronic left shoulder pain   3. Abnormal posture        Problem List Patient Active Problem List   Diagnosis Date Noted  . Normal delivery 10/02/2013  . Indication for care in labor or delivery 10/01/2013    Darrel Hoover  PT 10/03/2018, 12:11 PM  [place], [place], [address] Phone: [phone]   Fax:  [phone]  Name: [name] MRN: [id number] Date of Birth: Mar 11, 1985

## 2018-10-05 ENCOUNTER — Encounter: Payer: Self-pay | Admitting: Physical Therapy

## 2018-10-05 ENCOUNTER — Other Ambulatory Visit: Payer: Self-pay

## 2018-10-05 ENCOUNTER — Ambulatory Visit: Payer: Medicaid Other | Admitting: Physical Therapy

## 2018-10-05 DIAGNOSIS — R293 Abnormal posture: Secondary | ICD-10-CM

## 2018-10-05 DIAGNOSIS — R252 Cramp and spasm: Secondary | ICD-10-CM

## 2018-10-05 DIAGNOSIS — G8929 Other chronic pain: Secondary | ICD-10-CM

## 2018-10-06 ENCOUNTER — Encounter: Payer: Self-pay | Admitting: Physical Therapy
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
## 2018-10-09 ENCOUNTER — Ambulatory Visit: Payer: Medicaid Other

## 2018-10-12 ENCOUNTER — Ambulatory Visit: Payer: Medicaid Other

## 2018-10-17 ENCOUNTER — Ambulatory Visit: Payer: Medicaid Other

## 2018-10-26 ENCOUNTER — Ambulatory Visit: Payer: Medicaid Other | Admitting: Physical Therapy

## 2018-10-26 ENCOUNTER — Other Ambulatory Visit: Payer: Self-pay

## 2018-10-26 DIAGNOSIS — R293 Abnormal posture: Secondary | ICD-10-CM

## 2018-10-26 DIAGNOSIS — R252 Cramp and spasm: Secondary | ICD-10-CM

## 2018-10-26 DIAGNOSIS — G8929 Other chronic pain: Secondary | ICD-10-CM
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
## 2018-11-10 ENCOUNTER — Other Ambulatory Visit: Payer: Self-pay

## 2018-11-10 ENCOUNTER — Ambulatory Visit: Payer: Medicaid Other | Attending: Orthopaedic Surgery | Admitting: Physical Therapy

## 2018-11-10 ENCOUNTER — Encounter: Payer: Self-pay | Admitting: Physical Therapy

## 2018-11-10 DIAGNOSIS — R293 Abnormal posture: Secondary | ICD-10-CM | POA: Diagnosis present

## 2018-11-10 DIAGNOSIS — G8929 Other chronic pain: Secondary | ICD-10-CM | POA: Insufficient documentation

## 2018-11-10 DIAGNOSIS — M25512 Pain in left shoulder: Secondary | ICD-10-CM | POA: Insufficient documentation

## 2018-11-10 DIAGNOSIS — R252 Cramp and spasm: Secondary | ICD-10-CM | POA: Diagnosis present

## 2018-11-10 NOTE — Therapy (Signed)
[place], [place], [address] Phone: [phone]   Fax:  [phone]  Physical Therapy Treatment  Patient Details  Name: [name] MRN: [id number] Date of Birth: 10-01-1985 Referring Provider (PT): Ophelia Charter , MD   Encounter Date: 11/10/2018  PT End of Session - 11/10/18 1019    Visit Number  9    Number of Visits  16    Date for PT Re-Evaluation  12/07/18    Authorization Type  MCD    Authorization Time Period  8v 8/31-9/16    Authorization - Visit Number  1    Authorization - Number of Visits  8    PT Start Time  1019    PT Stop Time  1057    PT Time Calculation (min)  38 min    Activity Tolerance  Patient limited by pain       History reviewed. No pertinent past medical history.  Past Surgical History:  Procedure Laterality Date  . MANDIBLE FRACTURE SURGERY      There were no vitals filed for this visit.  Subjective Assessment - 11/10/18 1020    Subjective  Pt reports she is still having pain in the Lt shoulder - hasn't been in as she was waiting to hear is her insurance approved it. She is wanting to have an MRI as she feels like she has a lump around the shoulder    Currently in Pain?  Yes    Pain Score  8     Pain Location  Shoulder    Pain Orientation  Left    Pain Descriptors / Indicators  Aching;Sore    Pain Type  Chronic pain    Pain Radiating Towards  one episode of tingling into  the left hand - alieve took the pain away.    Pain Onset  More than a month ago    Pain Frequency  Constant    Aggravating Factors   driving and lying on er shoulder    Pain Relieving Factors  heat and ionto         [facility] PT Assessment - 11/10/18 0001      Assessment   Medical Diagnosis  Lt cuff syndrome    Referring Provider (PT)  Ophelia Charter , MD                   [facility] Adult PT Treatment/Exercise - 11/10/18 0001      Shoulder Exercises: Prone   Retraction   Strengthening;Left;20 reps;Weights   row - with arm off EOB   Other Prone Exercises  20 reps T's Lt UE with arm off EOB palm down, 20 Y's arm off EOB      Shoulder Exercises: Sidelying   External Rotation  Strengthening;Left;Weights   3x10   External Rotation Weight (lbs)  2    External Rotation Limitations  towel under elbow      Shoulder Exercises: ROM/Strengthening   UBE (Upper Arm Bike)  L2  6 min      Iontophoresis   Type of Iontophoresis  Dexamethasone    Location  Lt rhomboind trigger point    Dose  1.0 cc     Time  wear home patch      Manual Therapy   Soft tissue mobilization  pt with trigger point in the Lt rhomboids  - oinstructed in self triggerpoint release with ball onwall  PT Short Term Goals - 10/26/18 1033      PT SHORT TERM GOAL #1   Title  She will be independent with initial hEp    Baseline  no program    Time  2    Period  Weeks    Status  Achieved      PT SHORT TERM GOAL #2   Title  She will report pain improved 20% or more    Baseline  reports improvment of 50%    Time  2    Period  Weeks    Status  Achieved      PT SHORT TERM GOAL #3   Title  Full active motion of LT arm    Baseline  Full if encouraged to go full ROM    Time  2    Period  Weeks    Status  Achieved        PT Long Term Goals - 10/26/18 1034      PT LONG TERM GOAL #1   Title  She will be independent with all hEP issued    Baseline  indpendent with initial hEP    Time  6    Period  Weeks    Status  On-going      PT LONG TERM GOAL #2   Title  She will report pain decreased 50% or more with home and work tasks    Baseline  pain improved 50% with some activity    Time  6    Period  Weeks    Status  On-going      PT LONG TERM GOAL #3   Title  She will report pain  occasionally to 1-3/10.    Baseline  5/10 now    Time  6    Period  Weeks    Status  On-going      PT LONG TERM GOAL #4   Title  She will be able to tolerate green or blue band  with HEP demo incr strength and decr pain.    Baseline  can tolerate green and blue now    Time  6    Period  Weeks    Status  Achieved            Plan - 11/10/18 1054    Clinical Impression Statement  Pt expressed frustration with pain and functional limitations in her Lt shoulder complex.  HAd an epsidode of tingling into the Left hand last week that scared her.  She was issued a tennis ball to use for self trigger  point release to her tightness in the rhomboids that is a point of pain for her.  She was also encouraged to have consistent appointments so she can get ionto and have relief.  No goals met at this time. Would benefit from scapular stabilization and focused RTC strenthening.    Rehab Potential  Good    PT Frequency  2x / week    PT Duration  3 weeks    PT Treatment/Interventions  Dry needling;Manual techniques;Passive range of motion;Taping;Patient/family education;Therapeutic exercise;Therapeutic activities;Moist Heat;Cryotherapy;Iontophoresis '4mg'$ /ml Dexamethasone;Ultrasound    PT Next Visit Plan  cont ionto and focus on scapular stability and RTC strength    Consulted and Agree with Plan of Care  Patient       Patient will benefit from skilled therapeutic intervention in order to improve the following deficits and impairments:     Visit Diagnosis: Cramp and spasm  Chronic left shoulder pain  Abnormal posture     Problem List Patient Active Problem List   Diagnosis Date Noted  . Normal delivery 10/02/2013  . Indication for care in labor or delivery 10/01/2013    Jeral Pinch PT  11/10/2018, 10:58 AM  [facility] [address] [place], [place], [address] Phone: [phone]   Fax:  [phone]  Name: [name] MRN: [id number] Date of Birth: 1985/07/09

## 2018-11-14 ENCOUNTER — Other Ambulatory Visit: Payer: Self-pay

## 2018-11-14 ENCOUNTER — Ambulatory Visit: Payer: Medicaid Other

## 2018-11-14 DIAGNOSIS — G8929 Other chronic pain: Secondary | ICD-10-CM

## 2018-11-14 DIAGNOSIS — R293 Abnormal posture: Secondary | ICD-10-CM

## 2018-11-14 DIAGNOSIS — R252 Cramp and spasm: Secondary | ICD-10-CM | POA: Diagnosis not present

## 2018-11-14 DIAGNOSIS — M25512 Pain in left shoulder: Secondary | ICD-10-CM

## 2018-11-14 NOTE — Patient Instructions (Signed)
Prone ITY   X 10-20 reps   Daily , on elbows pland x 10 daily

## 2018-11-14 NOTE — Therapy (Signed)
[place], [place], [address] Phone: [phone]   Fax:  [phone]  Physical Therapy Treatment  Patient Details  Name: [name] MRN: [id number] Date of Birth: [birth year] Referring Provider (PT): Ophelia Charter , MD   Encounter Date: 11/14/2018  PT End of Session - 11/14/18 0837    Visit Number  10    Number of Visits  16    Date for PT Re-Evaluation  12/07/18    Authorization Type  MCD    Authorization Time Period  8v 8/31-9/27/20    Authorization - Visit Number  2    Authorization - Number of Visits  8    PT Start Time  0835       History reviewed. No pertinent past medical history.  Past Surgical History:  Procedure Laterality Date  . MANDIBLE FRACTURE SURGERY      There were no vitals filed for this visit.  Subjective Assessment - 11/14/18 0839    Pain Score  8     Pain Location  Shoulder    Pain Orientation  Left;Posterior    Pain Descriptors / Indicators  Aching;Sore    Pain Type  Chronic pain    Pain Onset  More than a month ago    Pain Frequency  Constant                       [facility] Adult PT Treatment/Exercise - 11/14/18 0001      Shoulder Exercises: Prone   Other Prone Exercises  20 reps T's Lt UE with arm off EOB palm down, 20 Y's arm off EOB      Shoulder Exercises: Sidelying   External Rotation  Left;20 reps    External Rotation Weight (lbs)  3    External Rotation Limitations  towel under elbow      Shoulder Exercises: ROM/Strengthening   UBE (Upper Arm Bike)  L2  6 min      Iontophoresis   Type of Iontophoresis  Dexamethasone    Location  Lt rhomboind trigger point    Dose  1.0 cc     Time  wear home patch      Manual Therapy   Soft tissue mobilization  periscapular area             PT Education - 11/14/18 0904    Education Details  HEP    Person(s) Educated  Patient    Methods  Explanation;Tactile cues;Verbal cues;Handout    Comprehension  Returned demonstration;Verbalized understanding       PT Short Term Goals - 10/26/18 1033      PT SHORT TERM GOAL #1   Title  She will be independent with initial hEp    Baseline  no program    Time  2    Period  Weeks    Status  Achieved      PT SHORT TERM GOAL #2   Title  She will report pain improved 20% or more    Baseline  reports improvment of 50%    Time  2    Period  Weeks    Status  Achieved      PT SHORT TERM GOAL #3   Title  Full active motion of LT arm    Baseline  Full if encouraged to go full ROM    Time  2    Period  Weeks    Status  Achieved  PT Long Term Goals - 10/26/18 1034      PT LONG TERM GOAL #1   Title  She will be independent with all hEP issued    Baseline  indpendent with initial hEP    Time  6    Period  Weeks    Status  On-going      PT LONG TERM GOAL #2   Title  She will report pain decreased 50% or more with home and work tasks    Baseline  pain improved 50% with some activity    Time  6    Period  Weeks    Status  On-going      PT LONG TERM GOAL #3   Title  She will report pain  occasionally to 1-3/10.    Baseline  5/10 now    Time  6    Period  Weeks    Status  On-going      PT LONG TERM GOAL #4   Title  She will be able to tolerate green or blue band with HEP demo incr strength and decr pain.    Baseline  can tolerate green and blue now    Time  6    Period  Weeks    Status  Achieved            Plan - 11/14/18 0905    Clinical Impression Statement  She is doing well with the exercises  and pain levels appear to be exaggerated. She does have and spot  medial scapula that is most tender. Benerally her myuscles are supple and scapiula can be moved easily off chest wall.    PT Treatment/Interventions  Dry needling;Manual techniques;Passive range of motion;Taping;Patient/family education;Therapeutic exercise;Therapeutic activities;Moist Heat;Cryotherapy;Iontophoresis 4mg /ml Dexamethasone;Ultrasound     PT Next Visit Plan  cont ionto and focus on scapular stability and RTC strength    PT Home Exercise Plan  scapula retraction,, unattached band exer, rhomboid stretch ,  behind back stretch,  attached row and extension, rockwood series, prone ITY, on elbow plank    Consulted and Agree with Plan of Care  Patient       Patient will benefit from skilled therapeutic intervention in order to improve the following deficits and impairments:  Pain, Impaired UE functional use, Decreased strength  Visit Diagnosis: Chronic left shoulder pain  Cramp and spasm  Abnormal posture     Problem List Patient Active Problem List   Diagnosis Date Noted  . Normal delivery 10/02/2013  . Indication for care in labor or delivery 10/01/2013    Caprice RedChasse, Jahquez Steffler M  PT 11/14/2018, 9:18 AM  [facility] [address] [place], [place], [address] Phone: [phone]   Fax:  [phone]  Name: Renaee Mundalona S Llewellyn MRN: [id number] Date of Birth: Jul 05, 1985

## 2018-11-16 ENCOUNTER — Ambulatory Visit: Payer: Medicaid Other

## 2018-11-21 ENCOUNTER — Ambulatory Visit: Payer: Medicaid Other | Admitting: Physical Therapy

## 2018-11-23 ENCOUNTER — Ambulatory Visit: Payer: Medicaid Other

## 2018-11-23 ENCOUNTER — Other Ambulatory Visit: Payer: Self-pay

## 2018-11-23 DIAGNOSIS — M25512 Pain in left shoulder: Secondary | ICD-10-CM

## 2018-11-23 DIAGNOSIS — R252 Cramp and spasm: Secondary | ICD-10-CM | POA: Diagnosis not present

## 2018-11-23 DIAGNOSIS — G8929 Other chronic pain: Secondary | ICD-10-CM

## 2018-11-23 DIAGNOSIS — R293 Abnormal posture: Secondary | ICD-10-CM
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
## 2018-12-04 ENCOUNTER — Ambulatory Visit: Payer: Medicaid Other | Attending: Orthopaedic Surgery | Admitting: Physical Therapy

## 2018-12-04 DIAGNOSIS — R293 Abnormal posture: Secondary | ICD-10-CM | POA: Insufficient documentation

## 2018-12-04 DIAGNOSIS — G8929 Other chronic pain: Secondary | ICD-10-CM | POA: Insufficient documentation

## 2018-12-04 DIAGNOSIS — M25512 Pain in left shoulder: Secondary | ICD-10-CM | POA: Insufficient documentation

## 2018-12-04 DIAGNOSIS — R252 Cramp and spasm: Secondary | ICD-10-CM | POA: Insufficient documentation

## 2018-12-06 ENCOUNTER — Ambulatory Visit: Payer: Medicaid Other | Admitting: Physical Therapy

## 2018-12-06 ENCOUNTER — Other Ambulatory Visit: Payer: Self-pay

## 2018-12-06 DIAGNOSIS — M25512 Pain in left shoulder: Secondary | ICD-10-CM | POA: Diagnosis present

## 2018-12-06 DIAGNOSIS — G8929 Other chronic pain: Secondary | ICD-10-CM

## 2018-12-06 DIAGNOSIS — R293 Abnormal posture: Secondary | ICD-10-CM

## 2018-12-06 DIAGNOSIS — R252 Cramp and spasm: Secondary | ICD-10-CM | POA: Diagnosis not present

## 2018-12-06 NOTE — Therapy (Signed)
[place], [place], [address] Phone: [phone]   Fax:  [phone]  Physical Therapy Treatment  Patient Details  Name: [name] MRN: [id number] Date of Birth: 03/27/1985 Referring Provider (PT): [name] , MD   Encounter Date: 12/06/2018  PT End of Session - 12/06/18 0837    Visit Number  12    Number of Visits  16    Date for PT Re-Evaluation  12/07/18    Authorization Type  MCD    Authorization Time Period  10/06-1023, 6v    Authorization - Visit Number  1    Authorization - Number of Visits  6    PT Start Time  [phone]    PT Stop Time  0917    PT Time Calculation (min)  40 min    Activity Tolerance  Patient tolerated treatment well    Behavior During Therapy  [name] for tasks assessed/performed       No past medical history on file.  Past Surgical History:  Procedure Laterality Date  . MANDIBLE FRACTURE SURGERY      There were no vitals filed for this visit.  Subjective Assessment - 12/06/18 0837    Subjective  Pt reports she is ready to try som edry needling. Not sleeping at all.Reports she had a mark on her shoulder from the patch.    Currently in Pain?  Yes    Pain Score  8     Pain Location  Shoulder    Pain Orientation  Left    Pain Type  Chronic pain    Pain Frequency  Constant    Aggravating Factors   sleeping, sitting still and using the phone    Pain Relieving Factors  heat and stretches.                       [facility] Adult PT Treatment/Exercise - 12/06/18 0001      Modalities   Modalities  Electrical Stimulation;Moist Heat      Moist Heat Therapy   Number Minutes Moist Heat  12 Minutes    Moist Heat Location  --   back and Lt shoulder     Electrical Stimulation   Electrical Stimulation Location  Lt shoulder    Electrical Stimulation Action  IFC t    Electrical Stimulation Parameters  to tolearnce    Electrical Stimulation Goals  Tone;Pain      Iontophoresis   Type of Iontophoresis  Other (comment)   not performed as pt reported she had a reaction last time     Manual Therapy   Manual therapy comments  skilled palpation and monitoring during DN, decresaed palpable tighness after tx.        Trigger Point Dry Needling - 12/06/18 0001    Consent Given?  Yes    Education Handout Provided  Yes    Muscles Treated Upper Quadrant  Rhomboids;Subscapularis;Teres major;Teres minor    Rhomboids Response  Palpable increased muscle length;Twitch response elicited   Lt lower   Subscapularis Response  Twitch response elicited;Palpable increased muscle length   Lt   Teres major Response  Palpable increased muscle length;Twitch response elicited   Lt   Teres minor Response  Twitch response elicited;Palpable increased muscle length   Lt           PT Education - 12/06/18 0909    Education Details  DN    Person(s) Educated  Patient  Methods  Explanation;Handout    Comprehension  Verbalized understanding       PT Short Term Goals - 10/26/18 1033      PT SHORT TERM GOAL #1   Title  She will be independent with initial hEp    Baseline  no program    Time  2    Period  Weeks    Status  Achieved      PT SHORT TERM GOAL #2   Title  She will report pain improved 20% or more    Baseline  reports improvment of 50%    Time  2    Period  Weeks    Status  Achieved      PT SHORT TERM GOAL #3   Title  Full active motion of LT arm    Baseline  Full if encouraged to go full ROM    Time  2    Period  Weeks    Status  Achieved        PT Long Term Goals - 11/23/18 0830      PT LONG TERM GOAL #1   Title  She will be independent with all hEP issued    Baseline  she is able to do all HEP issued so far    Time  3    Period  Weeks    Status  On-going      PT LONG TERM GOAL #2   Title  She will report pain decreased 50% or more with home and work tasks    Baseline  she continues to conme in at 8/10 unless taking heavier pain  meds.    Time  3    Period  Weeks    Status  On-going      PT LONG TERM GOAL #3   Title  She will report pain  occasionally to 1-3/10.  with home ad work tasks    Baseline  8/10 over last  few sessions    Time  3    Period  Weeks    Status  New      PT LONG TERM GOAL #4   Title  She will be able to tolerate green or blue band with HEP demo incr strength and decr pain.    Baseline  can tolerate green and blue now    Status  Achieved            Plan - 12/06/18 0910    Clinical Impression Statement  This was [name]'s first DN session.  She tolerated it well decreased palpable tightness at end of tx.  She also reports slight decreased in pain at end of session.  No new goals met, over all her lt shoulder ROM was [facility] per observation    Rehab Potential  Good    PT Frequency  2x / week    PT Treatment/Interventions  Dry needling;Manual techniques;Passive range of motion;Taping;Patient/family education;Therapeutic exercise;Therapeutic activities;Moist Heat;Cryotherapy;Iontophoresis 32m/ml Dexamethasone;Ultrasound    PT Next Visit Plan  assess response to DN - cont if beneficial    Consulted and Agree with Plan of Care  Patient       Patient will benefit from skilled therapeutic intervention in order to improve the following deficits and impairments:  Pain, Impaired UE functional use, Decreased strength  Visit Diagnosis: Cramp and spasm  Chronic left shoulder pain  Abnormal posture     Problem List Patient Active Problem List   Diagnosis Date Noted  . Normal delivery 10/02/2013  .  Indication for care in labor or delivery 10/01/2013    [name] PT 12/06/2018, [name], [place], [address] Phone: [phone]   Fax:  [phone]  Name: [name] MRN: [id number] Date of Birth: 25-May-1985

## 2018-12-06 NOTE — Patient Instructions (Signed)
.  Access Code: [email or web address]  URL: [email or web address]  Date: 12/06/2018  Prepared by: Jeral Pinch   Patient Education  Trigger Point Dry Needling

## 2018-12-12 ENCOUNTER — Encounter: Payer: Self-pay | Admitting: Physical Therapy

## 2018-12-12 ENCOUNTER — Ambulatory Visit: Payer: Medicaid Other | Admitting: Physical Therapy

## 2018-12-12 ENCOUNTER — Other Ambulatory Visit: Payer: Self-pay

## 2018-12-12 DIAGNOSIS — R252 Cramp and spasm: Secondary | ICD-10-CM

## 2018-12-12 DIAGNOSIS — G8929 Other chronic pain: Secondary | ICD-10-CM

## 2018-12-12 DIAGNOSIS — R293 Abnormal posture: Secondary | ICD-10-CM

## 2018-12-12 DIAGNOSIS — M25512 Pain in left shoulder: Secondary | ICD-10-CM
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
## 2018-12-14 ENCOUNTER — Encounter: Payer: Self-pay | Admitting: Physical Therapy

## 2018-12-14 ENCOUNTER — Ambulatory Visit: Payer: Medicaid Other | Admitting: Physical Therapy

## 2018-12-14 ENCOUNTER — Other Ambulatory Visit: Payer: Self-pay

## 2018-12-14 DIAGNOSIS — R252 Cramp and spasm: Secondary | ICD-10-CM | POA: Diagnosis not present

## 2018-12-14 DIAGNOSIS — R293 Abnormal posture: Secondary | ICD-10-CM

## 2018-12-14 DIAGNOSIS — M25512 Pain in left shoulder: Secondary | ICD-10-CM

## 2018-12-14 DIAGNOSIS — G8929 Other chronic pain: Secondary | ICD-10-CM

## 2018-12-14 NOTE — Therapy (Signed)
[place], [place], [address] Phone: [phone]   Fax:  [phone]  Physical Therapy Treatment  Patient Details  Name: [name] MRN: [id number] Date of Birth: 10/24/1985 Referring Provider (PT): Ophelia Charter , MD   Encounter Date: 12/14/2018  PT End of Session - 12/14/18 0809    Visit Number  14    Number of Visits  16    Date for PT Re-Evaluation  12/07/18    Authorization Type  MCD    Authorization Time Period  10/06-1023, 6v    Authorization - Visit Number  2    Authorization - Number of Visits  6    PT Start Time  [phone]   pt arrived late   PT Stop Time  0841    PT Time Calculation (min)  31 min    Activity Tolerance  Patient tolerated treatment well    Behavior During Therapy  [facility] for tasks assessed/performed       History reviewed. No pertinent past medical history.  Past Surgical History:  Procedure Laterality Date  . MANDIBLE FRACTURE SURGERY      There were no vitals filed for this visit.  Subjective Assessment - 12/14/18 0810    Subjective  sharp, stabbing pain just medial to Lt scapula. Feels like DN was helpful.    Currently in Pain?  Yes    Pain Score  8     Pain Location  Back    Pain Orientation  Left    Pain Descriptors / Indicators  Sharp                       [facility] Adult PT Treatment/Exercise - 12/14/18 0001      Shoulder Exercises: Seated   Other Seated Exercises  postural alignment- towel bw knees, alignment      Shoulder Exercises: Stretch   Other Shoulder Stretches  chest stretch in door    Other Shoulder Stretches  thoracic ext in chair      Manual Therapy   Manual therapy comments  skilled palpation and monitoring during TPDN    Joint Mobilization  thoracic & rib PA    Soft tissue mobilization  subscap. upper traps, left thoracic paraspinals       Trigger Point Dry Needling - 12/14/18 0001    Muscles Treated Head and Neck  Upper  trapezius    Upper Trapezius Response  Twitch reponse elicited;Palpable increased muscle length   Left   Rhomboids Response  Twitch response elicited;Palpable increased muscle length   LEft          PT Education - 12/14/18 1138    Education Details  TPDN & expected outcomes, posture, standing desk    Person(s) Educated  Patient    Methods  Explanation    Comprehension  Verbalized understanding;Need further instruction       PT Short Term Goals - 12/12/18 1404      PT SHORT TERM GOAL #1   Title  She will be independent with initial hEp    Baseline  no program    Time  2    Period  Weeks    Status  Achieved      PT SHORT TERM GOAL #2   Title  She will report pain improved 20% or more    Baseline  reports improvment of 50%    Time  2    Period  Weeks    Status  Achieved      PT SHORT TERM GOAL #3   Title  Full active motion of LT arm    Baseline  Full if encouraged to go full ROM    Time  2    Period  Weeks    Status  Achieved        PT Long Term Goals - 11/23/18 0830      PT LONG TERM GOAL #1   Title  She will be independent with all hEP issued    Baseline  she is able to do all HEP issued so far    Time  3    Period  Weeks    Status  On-going      PT LONG TERM GOAL #2   Title  She will report pain decreased 50% or more with home and work tasks    Baseline  she continues to conme in at 8/10 unless taking heavier pain meds.    Time  3    Period  Weeks    Status  On-going      PT LONG TERM GOAL #3   Title  She will report pain  occasionally to 1-3/10.  with home ad work tasks    Baseline  8/10 over last  few sessions    Time  3    Period  Weeks    Status  New      PT LONG TERM GOAL #4   Title  She will be able to tolerate green or blue band with HEP demo incr strength and decr pain.    Baseline  can tolerate green and blue now    Status  Achieved            Plan - 12/14/18 1139    Clinical Impression Statement  Pt reported soreness after  needling as expected. able to release subscap with manual therapy following DN. We discussed that soreness in  place of pain she presented with is a good thing and that postures and exercises are necessary for carryover. Pt felt discomfort in her low back with postural correction and we discussed the pulling from spasm created by sitting forward.    PT Treatment/Interventions  Dry needling;Manual techniques;Passive range of motion;Taping;Patient/family education;Therapeutic exercise;Therapeutic activities;Moist Heat;Cryotherapy;Iontophoresis 4mg /ml Dexamethasone;Ultrasound    PT Next Visit Plan  continue DN PRN, try prone periscap exercises    PT Home Exercise Plan  scapula retraction,, unattached band exer, rhomboid stretch ,  behind back stretch,  attached row and extension, rockwood series, prone ITY, on elbow plank    Consulted and Agree with Plan of Care  Patient       Patient will benefit from skilled therapeutic intervention in order to improve the following deficits and impairments:  Pain, Impaired UE functional use, Decreased strength  Visit Diagnosis: Cramp and spasm  Chronic left shoulder pain  Abnormal posture     Problem List Patient Active Problem List   Diagnosis Date Noted  . Normal delivery 10/02/2013  . Indication for care in labor or delivery 10/01/2013   Sanaz Scarlett C. Mosetta Ferdinand PT, DPT 12/14/18 11:43 AM   [facility] [address] [place], [place], [place] Phone: [phone]   Fax:  [phone]  Name: ALEECIA TAPIA MRN: Renaee Munda Date of Birth: [birth year]

## 2019-01-02 ENCOUNTER — Ambulatory Visit: Payer: Medicaid Other | Attending: Orthopaedic Surgery | Admitting: Physical Therapy

## 2019-01-02 ENCOUNTER — Other Ambulatory Visit: Payer: Self-pay

## 2019-01-02 ENCOUNTER — Ambulatory Visit: Payer: Medicaid Other | Admitting: Physical Therapy

## 2019-01-02 ENCOUNTER — Encounter: Payer: Self-pay | Admitting: Physical Therapy

## 2019-01-02 DIAGNOSIS — M25512 Pain in left shoulder: Secondary | ICD-10-CM | POA: Diagnosis present

## 2019-01-02 DIAGNOSIS — G8929 Other chronic pain: Secondary | ICD-10-CM

## 2019-01-02 DIAGNOSIS — R252 Cramp and spasm: Secondary | ICD-10-CM

## 2019-01-02 DIAGNOSIS — R293 Abnormal posture: Secondary | ICD-10-CM
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
## 2019-01-16 ENCOUNTER — Ambulatory Visit: Payer: Medicaid Other | Admitting: Physical Therapy

## 2019-01-23 ENCOUNTER — Ambulatory Visit: Payer: Medicaid Other | Admitting: Physical Therapy

## 2019-01-30 ENCOUNTER — Ambulatory Visit: Payer: Medicaid Other

## 2019-02-01 ENCOUNTER — Encounter: Payer: Self-pay | Admitting: Physical Therapy

## 2019-02-01 ENCOUNTER — Ambulatory Visit: Payer: Medicaid Other | Admitting: Physical Therapy

## 2019-02-01 ENCOUNTER — Other Ambulatory Visit: Payer: Self-pay

## 2019-02-01 ENCOUNTER — Ambulatory Visit: Payer: Medicaid Other | Attending: Orthopaedic Surgery | Admitting: Physical Therapy

## 2019-02-01 DIAGNOSIS — G8929 Other chronic pain: Secondary | ICD-10-CM | POA: Insufficient documentation

## 2019-02-01 DIAGNOSIS — R252 Cramp and spasm: Secondary | ICD-10-CM | POA: Insufficient documentation

## 2019-02-01 DIAGNOSIS — R293 Abnormal posture: Secondary | ICD-10-CM | POA: Diagnosis present

## 2019-02-01 DIAGNOSIS — M25512 Pain in left shoulder: Secondary | ICD-10-CM | POA: Diagnosis present
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
## 2019-02-02 ENCOUNTER — Ambulatory Visit: Payer: Medicaid Other | Admitting: Physical Therapy

## 2019-02-15 ENCOUNTER — Ambulatory Visit: Payer: Medicaid Other | Admitting: Physical Therapy

## 2019-03-05 ENCOUNTER — Ambulatory Visit: Payer: Medicaid Other | Attending: Orthopaedic Surgery | Admitting: Physical Therapy

## 2019-03-05 ENCOUNTER — Other Ambulatory Visit: Payer: Self-pay

## 2019-03-05 ENCOUNTER — Encounter: Payer: Self-pay | Admitting: Physical Therapy

## 2019-03-05 DIAGNOSIS — R293 Abnormal posture: Secondary | ICD-10-CM | POA: Insufficient documentation

## 2019-03-05 DIAGNOSIS — M25512 Pain in left shoulder: Secondary | ICD-10-CM | POA: Insufficient documentation

## 2019-03-05 DIAGNOSIS — R252 Cramp and spasm: Secondary | ICD-10-CM | POA: Insufficient documentation

## 2019-03-05 DIAGNOSIS — G8929 Other chronic pain: Secondary | ICD-10-CM | POA: Insufficient documentation
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
## 2019-03-16 ENCOUNTER — Other Ambulatory Visit: Payer: Self-pay

## 2019-03-16 ENCOUNTER — Encounter: Payer: Self-pay | Admitting: Physical Therapy

## 2019-03-16 ENCOUNTER — Ambulatory Visit: Payer: Medicaid Other | Admitting: Physical Therapy

## 2019-03-16 DIAGNOSIS — R293 Abnormal posture: Secondary | ICD-10-CM

## 2019-03-16 DIAGNOSIS — R252 Cramp and spasm: Secondary | ICD-10-CM

## 2019-03-16 DIAGNOSIS — G8929 Other chronic pain: Secondary | ICD-10-CM

## 2019-03-16 DIAGNOSIS — M25512 Pain in left shoulder: Secondary | ICD-10-CM

## 2019-03-16 NOTE — Therapy (Signed)
[facility] [address] [place], [place], [address] Phone: [phone]   Fax:  [phone]  Physical Therapy Treatment  Patient Details  Name: [name] MRN: [id number] Date of Birth: 12-18-1985 Referring Provider (PT): Ramond Marrow , MD   Encounter Date: 03/16/2019  PT End of Session - 03/16/19 1219    Visit Number  18    Number of Visits  20    Date for PT Re-Evaluation  03/30/19    Authorization Type  MCD    Authorization Time Period  1/14-1/27    Authorization - Visit Number  1    Authorization - Number of Visits  3    PT Start Time  1147    PT Stop Time  1232    PT Time Calculation (min)  45 min    Activity Tolerance  Patient tolerated treatment well    Behavior During Therapy  [facility] for tasks assessed/performed       History reviewed. No pertinent past medical history.  Past Surgical History:  Procedure Laterality Date  . MANDIBLE FRACTURE SURGERY      There were no vitals filed for this visit.  Subjective Assessment - 03/16/19 1148    Subjective  Stress in my neck today around my shoulder blade and under arm. Scheduled to get MRI and have to go see a pain specialist.    Patient Stated Goals  Stop hurting, be able to sit my desk without hurting and work    Currently in Pain?  Yes    Pain Score  8     Pain Location  Neck    Pain Orientation  Left    Pain Descriptors / Indicators  Shooting                       [facility] Adult PT Treatment/Exercise - 03/16/19 0001      Modalities   Modalities  Electrical Stimulation      Moist Heat Therapy   Number Minutes Moist Heat  15 Minutes   with estim   Moist Heat Location  Shoulder      Electrical Stimulation   Electrical Stimulation Location  LT shoulder    Electrical Stimulation Action  IFC     Electrical Stimulation Parameters  to tolerance with heat    Electrical Stimulation Goals  Pain      Manual Therapy   Manual therapy comments   skilled palpation and monitoring during TPDN    Soft tissue mobilization  periscap, upper trap, lats      Prosthetics   Prosthetic Care Comments   .       Trigger Point Dry Needling - 03/16/19 0001    Muscles Treated Upper Quadrant  Infraspinatus;Latissimus dorsi    Upper Trapezius Response  Twitch reponse elicited;Palpable increased muscle length    Rhomboids Response  Twitch response elicited;Palpable increased muscle length    Infraspinatus Response  Twitch response elicited;Palpable increased muscle length    Subscapularis Response  Twitch response elicited;Palpable increased muscle length    Latissimus dorsi Response  Twitch response elicited;Palpable increased muscle length             PT Short Term Goals - 12/12/18 1404      PT SHORT TERM GOAL #1   Title  She will be independent with initial hEp    Baseline  no program    Time  2    Period  Weeks    Status  Achieved      PT SHORT TERM GOAL #2   Title  She will report pain improved 20% or more    Baseline  reports improvment of 50%    Time  2    Period  Weeks    Status  Achieved      PT SHORT TERM GOAL #3   Title  Full active motion of LT arm    Baseline  Full if encouraged to go full ROM    Time  2    Period  Weeks    Status  Achieved        PT Long Term Goals - 03/06/19 6599      PT LONG TERM GOAL #1   Title  She will be independent with all hEP issued    Baseline  not doing them very often due to pain    Time  8    Period  Weeks    Status  New    Target Date  05/04/19      PT LONG TERM GOAL #2   Title  She will report pain decreased 50% or more with home and work tasks    Baseline  severe and debilitating, let go from her job due to inability to perform tasks    Time  8    Period  Weeks    Status  New    Target Date  05/04/19      PT LONG TERM GOAL #3   Title  She will report pain  occasionally to 1-3/10.  with home ad work tasks    Baseline  severe and debilitatin    Time  8    Period   Weeks    Status  New    Target Date  05/04/19      PT LONG TERM GOAL #4   Title  She will be able to tolerate green or blue band with HEP demo incr strength and decr pain.    Baseline  can tolerate green and blue now    Time  8    Status  Achieved      PT LONG TERM GOAL #5   Title  negative spurlings test on left side    Baseline  positive for recreation of concordant symptoms today    Time  8    Period  Weeks    Status  New    Target Date  05/04/19            Plan - 03/16/19 1227    Clinical Impression Statement  Use of dry needlingin a large area on Left side today. Was able to reach under scapula after some release for STM. ESTIM and heat applied at the end to decrease soreness    PT Treatment/Interventions  Dry needling;Manual techniques;Passive range of motion;Taping;Patient/family education;Therapeutic exercise;Therapeutic activities;Moist Heat;Cryotherapy;Iontophoresis 4mg /ml Dexamethasone;Ultrasound    PT Next Visit Plan  outcome of DN? gross stretching    PT Home Exercise Plan  scapula retraction,, unattached band exer, rhomboid stretch ,  behind back stretch,  attached row and extension, rockwood series, prone ITY, on elbow plank    Consulted and Agree with Plan of Care  Patient       Patient will benefit from skilled therapeutic intervention in order to improve the following deficits and impairments:  Pain, Impaired UE functional use, Decreased strength, Postural dysfunction  Visit Diagnosis: Chronic left shoulder pain  Cramp and spasm  Abnormal posture     Problem List  Patient Active Problem List   Diagnosis Date Noted  . Normal delivery 10/02/2013  . Indication for care in labor or delivery 10/01/2013    Barabara Motz C. Caralina Nop PT, DPT 03/16/19 12:32 PM   [facility] [address] [place], [place], [address] Phone: [phone]   Fax:  [phone]  Name: [name] MRN: [id number] Date of  Birth: 1986-02-24

## 2019-03-21 ENCOUNTER — Encounter: Payer: Self-pay | Admitting: Physical Therapy

## 2019-03-21 ENCOUNTER — Other Ambulatory Visit: Payer: Self-pay

## 2019-03-21 ENCOUNTER — Ambulatory Visit: Payer: Medicaid Other | Admitting: Physical Therapy

## 2019-03-21 DIAGNOSIS — R293 Abnormal posture: Secondary | ICD-10-CM

## 2019-03-21 DIAGNOSIS — R252 Cramp and spasm: Secondary | ICD-10-CM

## 2019-03-21 DIAGNOSIS — G8929 Other chronic pain: Secondary | ICD-10-CM

## 2019-03-21 DIAGNOSIS — M25512 Pain in left shoulder: Secondary | ICD-10-CM | POA: Diagnosis not present
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
## 2019-03-23 ENCOUNTER — Encounter: Payer: Self-pay | Admitting: Physical Therapy

## 2019-03-23 ENCOUNTER — Ambulatory Visit: Payer: Medicaid Other | Admitting: Physical Therapy

## 2019-03-23 ENCOUNTER — Other Ambulatory Visit: Payer: Self-pay

## 2019-03-23 DIAGNOSIS — R252 Cramp and spasm: Secondary | ICD-10-CM

## 2019-03-23 DIAGNOSIS — M25512 Pain in left shoulder: Secondary | ICD-10-CM | POA: Diagnosis not present

## 2019-03-23 DIAGNOSIS — R293 Abnormal posture: Secondary | ICD-10-CM

## 2019-03-23 DIAGNOSIS — G8929 Other chronic pain: Secondary | ICD-10-CM

## 2019-03-23 NOTE — Therapy (Addendum)
[place], [place], [address] Phone: [phone]   Fax:  [phone]  Physical Therapy Treatment/ERO  Patient Details  Name: [name] MRN: [id number] Date of Birth: [birth year] Referring Provider (PT): Ophelia Charter , MD   Encounter Date: 03/23/2019  PT End of Session - 03/23/19 0915    Visit Number  20    Number of Visits  --    Date for PT Re-Evaluation  04/27/19    Authorization Type  MCD    Authorization Time Period  1/14-1/27    Authorization - Visit Number  3    Authorization - Number of Visits  3    PT Start Time  0845    PT Stop Time  0915   requested to leave early.   PT Time Calculation (min)  30 min       History reviewed. No pertinent past medical history.  Past Surgical History:  Procedure Laterality Date  . MANDIBLE FRACTURE SURGERY      There were no vitals filed for this visit.  Subjective Assessment - 03/23/19 0849    Subjective  Pain not right now. Still waiting on MRI to be scheduled.    Currently in Pain?  No/denies    Pain Score  7    never lower than a 7/10   Pain Location  Shoulder    Pain Orientation  Left    Pain Descriptors / Indicators  Aching;Shooting    Pain Type  Chronic pain    Aggravating Factors   constant, movement    Pain Relieving Factors  massage, therapy         [facility] PT Assessment - 03/23/19 0001      AROM   Overall AROM Comments  no pain with AROM     Left Shoulder Flexion  155 Degrees    Left Shoulder ABduction  155 Degrees    Left Shoulder Internal Rotation  --   T8   Left Shoulder External Rotation  --   reach T3     PROM   Overall PROM Comments  GHJ full,       Strength   Strength Assessment Site  Shoulder    Right/Left Shoulder  Left    Left Shoulder Flexion  4-/5    Left Shoulder ABduction  4-/5    Left Shoulder Internal Rotation  4-/5    Left Shoulder External Rotation  4-/5                   [facility] Adult PT  Treatment/Exercise - 03/23/19 0001      Shoulder Exercises: Standing   External Rotation  15 reps    Theraband Level (Shoulder External Rotation)  Level 2 (Red)    Internal Rotation  15 reps    Theraband Level (Shoulder Internal Rotation)  Level 2 (Red)    Flexion  10 reps    Theraband Level (Shoulder Flexion)  Level 2 (Red)    Extension  15 reps    Theraband Level (Shoulder Extension)  Level 2 (Red)    Row  15 reps    Theraband Level (Shoulder Row)  Level 2 (Red)      Shoulder Exercises: Pulleys   Flexion  1 minute    Scaption  1 minute      Shoulder Exercises: Stretch   Corner Stretch Limitations  doorway stretch    Other Shoulder Stretches  cross body stretch  PT Short Term Goals - 03/21/19 0936      PT SHORT TERM GOAL #1   Title  She will be independent with initial hEp    Baseline  no program    Time  2    Period  Weeks    Status  Achieved      PT SHORT TERM GOAL #2   Title  She will report pain improved 20% or more    Baseline  reports improvment of 50%    Time  2    Period  Weeks    Status  Achieved      PT SHORT TERM GOAL #3   Title  Full active motion of LT arm    Baseline  Full if encouraged to go full ROM    Time  2    Period  Weeks    Status  Achieved        PT Long Term Goals - 03/23/19 6314      PT LONG TERM GOAL #1   Title  She will be independent with all hEP issued    Baseline  not doing them very often due to pain    Time  8    Period  Weeks    Status  On-going      PT LONG TERM GOAL #2   Title  She will report pain decreased 50% or more with home and work tasks    Baseline  10% improvement in pain.    Time  8    Period  Weeks    Status  On-going      PT LONG TERM GOAL #3   Title  She will report pain  occasionally to 1-3/10.  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    Baseline  tolerates red band now, has tolerated blue and green previously    Time  8    Period  Weeks    Status  On-going      PT LONG TERM GOAL #5   Title  negative spurlings test on left side    Baseline  positive for recreation of concordant symptoms    Time  8    Period  Weeks    Status  On-going            Plan - 03/23/19 9702    Clinical Impression Statement  Pt arrives reporting no pain at beginning of session. Able to tolerate ROM and strengthening exercises with c/o after red band external roation of fatigue. Handles attached to band to make it easier for patient to perform theraband HEP. Goals assess and patient reports 10% decrease in pain. Her AROM and PROM are full. She has decreased strength in all planes of left shoulder.She will benefit from continued skilled PT in order to increase stability through cervical spine and into scapular/shoulder regions to decrease pain and improve functional ability.    PT Frequency  2x / week    PT Duration  4 weeks    PT Treatment/Interventions  Dry needling;Manual techniques;Passive range of motion;Taping;Patient/family education;Therapeutic exercise;Therapeutic activities;Moist Heat;Cryotherapy;Iontophoresis 4mg /ml Dexamethasone;Ultrasound    PT Next Visit Plan  gross  stretching and strengthening    PT Home Exercise Plan  scapula retraction,, unattached band exer, rhomboid stretch ,  behind back stretch,  attached row and extension, rockwood series, prone ITY, on elbow plank    Consulted and Agree with Plan of Care  Patient       Patient will benefit from skilled therapeutic intervention in order to improve the following deficits and impairments:  Pain, Impaired UE functional use, Decreased strength, Postural dysfunction  Visit Diagnosis: Cramp and spasm  Abnormal posture  Chronic left shoulder pain     Problem List Patient Active Problem List    Diagnosis Date Noted  . Normal delivery 10/02/2013  . Indication for care in labor or delivery 10/01/2013    Army Fossa, PTA 03/23/2019, 10:46 AM  [facility] [address] [place], [place], [address] Phone: [phone]   Fax:  [phone]  Name: [name] MRN: [id number] Date of Birth: 1985-07-31  Gwenlyn Found. Hightower PT, DPT 03/23/19 10:46 AM

## 2019-03-23 NOTE — Addendum Note (Signed)
Addended by: Fredrich Romans on: 03/23/2019 10:47 AM   Modules accepted: Orders

## 2019-04-04 ENCOUNTER — Ambulatory Visit: Payer: Medicaid Other | Attending: Orthopaedic Surgery | Admitting: Physical Therapy

## 2019-04-04 ENCOUNTER — Encounter: Payer: Self-pay | Admitting: Physical Therapy

## 2019-04-04 ENCOUNTER — Other Ambulatory Visit: Payer: Self-pay

## 2019-04-04 DIAGNOSIS — R293 Abnormal posture: Secondary | ICD-10-CM | POA: Diagnosis present

## 2019-04-04 DIAGNOSIS — R252 Cramp and spasm: Secondary | ICD-10-CM | POA: Diagnosis present

## 2019-04-04 DIAGNOSIS — M25512 Pain in left shoulder: Secondary | ICD-10-CM | POA: Insufficient documentation

## 2019-04-04 DIAGNOSIS — G8929 Other chronic pain: Secondary | ICD-10-CM

## 2019-04-04 NOTE — Therapy (Signed)
[facility] [address] [place], [place], [address] Phone: [phone]   Fax:  [phone]  Physical Therapy Treatment  Patient Details  Name: [name] MRN: [id number] Date of Birth: 1985-03-05 Referring Provider (PT): Ramond Marrow , MD   Encounter Date: 04/04/2019  PT End of Session - 04/04/19 0851    Visit Number  21    Date for PT Re-Evaluation  04/27/19    Authorization Type  MCD    Authorization Time Period  2/2-3/1    Authorization - Visit Number  1    Authorization - Number of Visits  8    PT Start Time  0846    PT Stop Time  0934    PT Time Calculation (min)  48 min    Activity Tolerance  Patient tolerated treatment well    Behavior During Therapy  [facility] for tasks assessed/performed       History reviewed. No pertinent past medical history.  Past Surgical History:  Procedure Laterality Date  . MANDIBLE FRACTURE SURGERY      There were no vitals filed for this visit.  Subjective Assessment - 04/04/19 0849    Subjective  A lot of dead wait on my shoulders. There is never a day I am not in pain. Denies HA today.    Patient Stated Goals  Stop hurting, be able to sit my desk without hurting and work    Currently in Pain?  Yes    Pain Score  7    Lt, Rt 2/10   Pain Location  Shoulder    Pain Orientation  Left;Right    Pain Descriptors / Indicators  Sore    Aggravating Factors   constant pain    Pain Relieving Factors  therapy                       [facility] Adult PT Treatment/Exercise - 04/04/19 0001      Shoulder Exercises: Prone   Retraction  10 reps      Shoulder Exercises: Pulleys   Flexion  2 minutes      Modalities   Modalities  Traction      Traction   Type of Traction  Cervical    Min (lbs)  5    Max (lbs)  15    Hold Time  60    Rest Time  20    Time  15 min      Manual Therapy   Manual therapy comments  skilled palpation and monitoring during TPDN    Soft tissue  mobilization  bil upper trap       Trigger Point Dry Needling - 04/04/19 0001    Muscles Treated Head and Neck  Levator scapulae    Upper Trapezius Response  Twitch reponse elicited;Palpable increased muscle length   bil   Levator Scapulae Response  Twitch response elicited;Palpable increased muscle length   Rt          PT Education - 04/04/19 1150    Education Details  TPDN, mechanical traction, posture with phone, hair adding weight    Person(s) Educated  Patient    Methods  Explanation    Comprehension  Verbalized understanding;Need further instruction       PT Short Term Goals - 03/21/19 8338      PT SHORT TERM GOAL #1   Title  She will be independent with initial hEp    Baseline  no program  Time  2    Period  Weeks    Status  Achieved      PT SHORT TERM GOAL #2   Title  She will report pain improved 20% or more    Baseline  reports improvment of 50%    Time  2    Period  Weeks    Status  Achieved      PT SHORT TERM GOAL #3   Title  Full active motion of LT arm    Baseline  Full if encouraged to go full ROM    Time  2    Period  Weeks    Status  Achieved        PT Long Term Goals - 03/23/19 0932      PT LONG TERM GOAL #1   Title  She will be independent with all hEP issued    Baseline  not doing them very often due to pain    Time  8    Period  Weeks    Status  On-going      PT LONG TERM GOAL #2   Title  She will report pain decreased 50% or more with home and work tasks    Baseline  10% improvement in pain.    Time  8    Period  Weeks    Status  On-going      PT LONG TERM GOAL #3   Title  She will report pain  occasionally to 1-3/10.  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    Baseline  tolerates red band now, has tolerated blue  and green previously    Time  8    Period  Weeks    Status  On-going      PT LONG TERM GOAL #5   Title  negative spurlings test on left side    Baseline  positive for recreation of concordant symptoms    Time  8    Period  Weeks    Status  On-going            Plan - 04/04/19 3557    Clinical Impression Statement  TPDN produced twitch response in bil upper traps and Rt levator scap followed by soreness as expected. She continues to have pain every day so we tried mechanical traction in order to make a change. Pt feels that massage makes the biggest difference.    PT Treatment/Interventions  Dry needling;Manual techniques;Passive range of motion;Taping;Patient/family education;Therapeutic exercise;Therapeutic activities;Moist Heat;Cryotherapy;Iontophoresis 4mg /ml Dexamethasone;Ultrasound;Traction    PT Next Visit Plan  gross stretching and strengthening, MRI results?    PT Home Exercise Plan  scapula retraction,, unattached band exer, rhomboid stretch ,  behind back stretch,  attached row and extension, rockwood series, prone ITY, on elbow plank    Consulted and Agree with Plan of Care  Patient       Patient will benefit from skilled therapeutic intervention in order to improve the following deficits and impairments:  Pain, Impaired UE functional use, Decreased strength, Postural dysfunction  Visit Diagnosis: Cramp and spasm  Abnormal posture  Chronic left shoulder pain     Problem List  Patient Active Problem List   Diagnosis Date Noted  . Normal delivery 10/02/2013  . Indication for care in labor or delivery 10/01/2013    Maiana Hennigan C. Christianna Belmonte PT, DPT 04/04/19 11:50 AM   [facility] [address] [place], [place], [address] Phone: [phone]   Fax:  [phone]  Name: [name] MRN: [id number] Date of Birth: 01-02-1986

## 2019-04-06 ENCOUNTER — Other Ambulatory Visit: Payer: Self-pay

## 2019-04-06 ENCOUNTER — Ambulatory Visit: Payer: Medicaid Other | Admitting: Physical Therapy

## 2019-04-06 ENCOUNTER — Encounter: Payer: Self-pay | Admitting: Physical Therapy

## 2019-04-06 DIAGNOSIS — G8929 Other chronic pain: Secondary | ICD-10-CM

## 2019-04-06 DIAGNOSIS — R252 Cramp and spasm: Secondary | ICD-10-CM | POA: Diagnosis not present

## 2019-04-06 DIAGNOSIS — R293 Abnormal posture: Secondary | ICD-10-CM

## 2019-04-06 DIAGNOSIS — M25512 Pain in left shoulder: Secondary | ICD-10-CM

## 2019-04-06 NOTE — Therapy (Signed)
[place], [place], [address] Phone: [phone]   Fax:  [phone]  Physical Therapy Treatment/Discharge  Patient Details  Name: [name] MRN: [id number] Date of Birth: 1985-09-01 Referring Provider (PT): Ophelia Charter , MD   Encounter Date: 04/06/2019  PT End of Session - 04/06/19 0853    Visit Number  22    Date for PT Re-Evaluation  04/27/19    Authorization Type  MCD    Authorization Time Period  2/2-3/1    Authorization - Visit Number  2    Authorization - Number of Visits  8    PT Start Time  [phone]   pt arrived late   PT Stop Time  0905    PT Time Calculation (min)  12 min    Behavior During Therapy  [facility] for tasks assessed/performed       History reviewed. No pertinent past medical history.  Past Surgical History:  Procedure Laterality Date  . MANDIBLE FRACTURE SURGERY      There were no vitals filed for this visit.  Subjective Assessment - 04/06/19 0853    Subjective  I was still in pain after traction. The pain doctor did an EKG pain test. I did not sleep last night.    Patient Stated Goals  Stop hurting, be able to sit my desk without hurting and work    Currently in Pain?  Yes    Pain Score  9     Pain Location  Shoulder    Pain Orientation  Right;Left    Pain Descriptors / Indicators  --   pain   Aggravating Factors   constant    Pain Relieving Factors  therapy- short term         [facility] PT Assessment - 04/06/19 0001      Assessment   Medical Diagnosis  Lt cuff syndrome    Referring Provider (PT)  Ophelia Charter , MD    Onset Date/Surgical Date  --   about a year ago   Hand Dominance  Right      Prior Function   Vocation Requirements  owns cleaning business      Sensation   Additional Comments  tingling in UEs on and off      Posture/Postural Control   Posture Comments  able to demonstrate good resting posture, looks down at phone occasionally      AROM   Overall  AROM Comments  incr neck pain will all AROM; GHJ AROM WFL      Spurling's   Findings  Positive    Side  Left    Comment  reduced with distraction                           PT Education - 04/06/19 0906    Education Details  Posture, goals, POC, pain mgmt & MRI    Person(s) Educated  Patient    Methods  Explanation    Comprehension  Verbalized understanding       PT Short Term Goals - 03/21/19 0936      PT SHORT TERM GOAL #1   Title  She will be independent with initial hEp    Baseline  no program    Time  2    Period  Weeks    Status  Achieved      PT SHORT TERM GOAL #2   Title  She will report pain  improved 20% or more    Baseline  reports improvment of 50%    Time  2    Period  Weeks    Status  Achieved      PT SHORT TERM GOAL #3   Title  Full active motion of LT arm    Baseline  Full if encouraged to go full ROM    Time  2    Period  Weeks    Status  Achieved        PT Long Term Goals - 04/06/19 7902      PT LONG TERM GOAL #1   Title  She will be independent with all hEP issued    Status  Achieved      PT LONG TERM GOAL #2   Title  She will report pain decreased 50% or more with home and work tasks    Status  Not Met      PT LONG TERM GOAL #3   Title  She will report pain  occasionally to 1-3/10.  with home ad work tasks    Baseline  still very severe    Status  Not Met      PT LONG TERM GOAL #4   Title  She will be able to tolerate green or blue band with HEP demo incr strength and decr pain.    Status  Not Met      PT LONG TERM GOAL #5   Title  negative spurlings test on left side    Baseline  positive for recreation of concordant symptoms    Status  Not Met            Plan - 04/06/19 0907    Clinical Impression Statement  At this time Jovon will be d/c from PT as she is not gaining and long term carryover from treatments. She is scheduled for MRI and seeing pain management and we dicussed returning to PT after these  tests if the MDs feel that it will be helpful at that point. Pt agreed and I encouraged her to contact me with any further questions.    PT Treatment/Interventions  Dry needling;Manual techniques;Passive range of motion;Taping;Patient/family education;Therapeutic exercise;Therapeutic activities;Moist Heat;Cryotherapy;Iontophoresis '4mg'$ /ml Dexamethasone;Ultrasound;Traction    PT Home Exercise Plan  scapula retraction,, unattached band exer, rhomboid stretch ,  behind back stretch,  attached row and extension, rockwood series, prone ITY, on elbow plank    Consulted and Agree with Plan of Care  Patient       Patient will benefit from skilled therapeutic intervention in order to improve the following deficits and impairments:  Pain, Impaired UE functional use, Decreased strength, Postural dysfunction  Visit Diagnosis: Cramp and spasm  Abnormal posture  Chronic left shoulder pain     Problem List Patient Active Problem List   Diagnosis Date Noted  . Normal delivery 10/02/2013  . Indication for care in labor or delivery 10/01/2013   PHYSICAL THERAPY DISCHARGE SUMMARY  Visits from Start of Care: 22  Current functional level related to goals / functional outcomes: See above   Remaining deficits: See above   Education / Equipment: Anatomy of condition, POC, HEP, exercise form/rationale  Plan: Patient agrees to discharge.  Patient goals were not met. Patient is being discharged due to lack of progress.  ?????      [place] Karl C. Tanyiah Laurich PT, DPT 04/06/19 9:09 AM   [place], [place], [address] Phone: [phone]   Fax:  [phone]  Name: SHAMEIKA SPEELMAN MRN: [id number] Date of Birth: 1985-03-22

## 2019-04-11 ENCOUNTER — Ambulatory Visit: Payer: Medicaid Other | Admitting: Physical Therapy

## 2019-04-13 ENCOUNTER — Ambulatory Visit: Payer: Medicaid Other | Admitting: Physical Therapy

## 2019-07-03 ENCOUNTER — Ambulatory Visit: Payer: Medicaid Other | Admitting: Physical Therapy

## 2019-07-09 ENCOUNTER — Ambulatory Visit: Payer: Medicaid Other | Attending: Internal Medicine

## 2019-07-23 ENCOUNTER — Ambulatory Visit: Payer: Medicaid Other

## 2019-08-02 ENCOUNTER — Other Ambulatory Visit: Payer: Self-pay

## 2019-08-02 ENCOUNTER — Ambulatory Visit: Payer: Medicaid Other | Attending: Internal Medicine | Admitting: Physical Therapy

## 2019-08-02 ENCOUNTER — Encounter: Payer: Self-pay | Admitting: Physical Therapy

## 2019-08-02 DIAGNOSIS — G8929 Other chronic pain: Secondary | ICD-10-CM | POA: Diagnosis present

## 2019-08-02 DIAGNOSIS — M25512 Pain in left shoulder: Secondary | ICD-10-CM | POA: Diagnosis present

## 2019-08-02 DIAGNOSIS — R252 Cramp and spasm: Secondary | ICD-10-CM | POA: Diagnosis present

## 2019-08-02 DIAGNOSIS — R293 Abnormal posture: Secondary | ICD-10-CM | POA: Diagnosis present

## 2019-08-03 ENCOUNTER — Encounter: Payer: Self-pay | Admitting: Physical Therapy
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
## 2019-08-20 ENCOUNTER — Ambulatory Visit: Payer: Medicaid Other | Admitting: Physical Therapy

## 2019-08-21 ENCOUNTER — Other Ambulatory Visit: Payer: Self-pay

## 2019-08-21 ENCOUNTER — Ambulatory Visit: Payer: Medicaid Other | Admitting: Physical Therapy

## 2019-08-21 ENCOUNTER — Encounter: Payer: Self-pay | Admitting: Physical Therapy

## 2019-08-21 DIAGNOSIS — M25512 Pain in left shoulder: Secondary | ICD-10-CM

## 2019-08-21 DIAGNOSIS — R252 Cramp and spasm: Secondary | ICD-10-CM | POA: Diagnosis not present

## 2019-08-21 DIAGNOSIS — R293 Abnormal posture: Secondary | ICD-10-CM

## 2019-08-21 NOTE — Therapy (Addendum)
[facility] [address] [place], [place], [address] Phone: [phone]   Fax:  [phone]  Physical Therapy Treatment/Discharge   Patient Details  Name: [name] MRN: [id number] Date of Birth: Jul 06, 1985 Referring Provider (PT): Dr Neomia Dear    Encounter Date: 08/21/2019   PT End of Session - 08/21/19 1028    Visit Number 2    Number of Visits 6    Date for PT Re-Evaluation 09/13/19    Authorization Time Period 08/17/2019 - 09/06/2019    Authorization - Visit Number 1    Authorization - Number of Visits 3    PT Start Time 1028   pt arrived 13 min late   PT Stop Time 1101    PT Time Calculation (min) 33 min    Activity Tolerance Patient tolerated treatment well    Behavior During Therapy [facility] for tasks assessed/performed           History reviewed. No pertinent past medical history.  Past Surgical History:  Procedure Laterality Date  . MANDIBLE FRACTURE SURGERY      There were no vitals filed for this visit.   Subjective Assessment - 08/21/19 1031    Subjective "pt  reports she has been doing her exercises every day and the pain today is a 9/10 in the L neck/ shoulder and side"    Currently in Pain? Yes    Pain Score 9     Pain Orientation Left    Pain Descriptors / Indicators Aching    Pain Type Chronic pain    Pain Onset More than a month ago    Pain Frequency Intermittent    Aggravating Factors  sleeping, sitting, driving, any activity ,    Pain Relieving Factors nothing helps    Effect of Pain on Daily Activities difficulty,              [facility] PT Assessment - 08/21/19 0001      Assessment   Medical Diagnosis Left Scapula pain     Referring Provider (PT) Dr Neomia Dear                          [facility] Adult PT Treatment/Exercise - 08/21/19 0001      Self-Care   Other Self-Care Comments  how to perform trigger point release using       Exercises   Exercises Neck      Neck  Exercises: Machines for Strengthening   UBE (Upper Arm Bike) L1 x 4 min    fwd/bwd x 2 min ea.     Neck Exercises: Supine   Neck Retraction 10 reps;5 secs   cues for proper form     Shoulder Exercises: Seated   Other Seated Exercises bicep curl 1 x 12 with 5#, 1 x 12 LUE military press with 5#      Manual Therapy   Manual therapy comments sub-occipital release x 5 min, trigger point release along the L upper trap using theracane.       Neck Exercises: Stretches   Upper Trapezius Stretch 2 reps;Left;30 seconds    Levator Stretch 2 reps;30 seconds;Left                  PT Education - 08/21/19 1041    Education Details reviewed attendance policy and previously proivded HEP.    Person(s) Educated Patient    Methods Explanation;Verbal cues;Handout    Comprehension Verbalized understanding;Verbal cues  required            PT Short Term Goals - 08/03/19 1249      PT SHORT TERM GOAL #1   Title She will be independent with initial hEp    Baseline has not been working on exercises    Time 3    Period Weeks    Status New    Target Date 08/24/19      PT SHORT TERM GOAL #2   Title She will report pain improved 20% or more    Baseline 9/10 pain with movement of her arm    Time 3    Period Weeks    Status New    Target Date 08/24/19      PT SHORT TERM GOAL #3   Title Patient will increase left UE strength by 1 grade    Baseline 3+/5 left shoulder flexion    Time 3    Period Weeks    Status New    Target Date 08/24/19             PT Long Term Goals - 08/03/19 1251      PT LONG TERM GOAL #1   Title Patient will sit at her computer for >2 hours withou pain    Baseline 15-20 minutes before pain starts to increase    Time 6    Period Weeks    Status New    Target Date 09/14/19      PT LONG TERM GOAL #2   Title Patient will reach overhad with 2lb object without pain in order to perfrom work tasks    Baseline difficulty reaching overhead    Time 6    Period  Weeks    Status New    Target Date 09/14/19                 Plan - 08/21/19 1040    Clinical Impression Statement limited session due to pt arrived 13 min late and spent the first 10 min of her session on the phone. pt reports the pain was a 9/10 today but exhibited no visual indications that would signify that level of pain. focused session on self trigger point releaes and sub-occipital release. continued stetching and L shoulder strengthening which she was able to do well.    PT Treatment/Interventions Dry needling;Manual techniques;Passive range of motion;Taping;Patient/family education;Therapeutic exercise;Therapeutic activities;Moist Heat;Cryotherapy;Iontophoresis '4mg'$ /ml Dexamethasone;Ultrasound;Traction    PT Next Visit Plan gross stretching and strengthening, MRI results, hows trigger    PT Home Exercise Plan scapula retraction,, unattached band exer, rhomboid stretch ,  behind back stretch,  attached row and extension, rockwood series, prone ITY, on elbow plank    Consulted and Agree with Plan of Care Patient           Patient will benefit from skilled therapeutic intervention in order to improve the following deficits and impairments:  Pain, Impaired UE functional use, Decreased strength, Postural dysfunction, Abnormal gait, Decreased endurance, Increased muscle spasms, Difficulty walking, Decreased range of motion, Cardiopulmonary status limiting activity, Decreased activity tolerance, Decreased safety awareness  Visit Diagnosis: Cramp and spasm  Abnormal posture  Chronic left shoulder pain   PHYSICAL THERAPY DISCHARGE SUMMARY  Visits from Start of Care:  2  Current functional level related to goals / functional outcomes: Patient discharged 2nd to non-compliance. 3 no-shows and showed up toi her 1 appointment >15 min late. Patient will require a new script to return to therapy.  Remaining deficits: Unknown  Education / Equipment: Unknown   Plan: Patient  agrees to discharge.  Patient goals were not met. Patient is being discharged due to not returning since the last visit.  ?????       Problem List Patient Active Problem List   Diagnosis Date Noted  . Normal delivery 10/02/2013  . Indication for care in labor or delivery 10/01/2013    Starr Lake PT, DPT, LAT, ATC  08/21/19  11:05 AM   Addendum by Carolyne Littles PT DPT  09/14/2019    [place] [facility] [address] [place], [place], [address] Phone: [phone]   Fax:  [phone]  Name: [name] MRN: [id number] Date of Birth: 12-21-1985

## 2019-08-31 ENCOUNTER — Ambulatory Visit: Payer: Medicaid Other | Attending: Internal Medicine | Admitting: Physical Therapy

## 2019-09-04 ENCOUNTER — Telehealth: Payer: Self-pay | Admitting: Physical Therapy

## 2019-09-04 NOTE — Telephone Encounter (Signed)
Spoke with patient regarding 4th no-show appointment. She also has an appointment that she canceled and one she showed up 20 minutes late for. The system shows that we have her correct number and a reminder call was placed. She also has a sheet of her appointments at home. Patient was advised that per our policy she has been discharged. The patient is aware she will need a new prescription to return to therapy and she is aware that the same attendance policy will apply.

## 2019-10-20 ENCOUNTER — Ambulatory Visit: Payer: Medicaid Other | Attending: Internal Medicine | Admitting: Physical Therapy

## 2019-10-20 ENCOUNTER — Encounter: Payer: Self-pay | Admitting: Physical Therapy

## 2019-10-20 DIAGNOSIS — M25512 Pain in left shoulder: Secondary | ICD-10-CM | POA: Insufficient documentation

## 2019-10-20 DIAGNOSIS — G8929 Other chronic pain: Secondary | ICD-10-CM | POA: Diagnosis present

## 2019-10-21 ENCOUNTER — Encounter: Payer: Self-pay | Admitting: Physical Therapy

## 2019-10-21 NOTE — Therapy (Signed)
[facility] [address] [place], [place], [address] Phone: [phone]   Fax:  [phone]  Physical Therapy Evaluation  Patient Details  Name: [name] MRN: [id number] Date of Birth: 10-12-1985 Referring Provider (PT): Dr Neomia Dear    Encounter Date: 10/20/2019   PT End of Session - 10/21/19 2135    Visit Number 1    Authorization Type MCD well[employer]    PT Start Time 1206    PT Stop Time 1234    PT Time Calculation (min) 28 min    Activity Tolerance Patient limited by pain    Behavior During Therapy Anxious           History reviewed. No pertinent past medical history.  Past Surgical History:  Procedure Laterality Date  . MANDIBLE FRACTURE SURGERY      There were no vitals filed for this visit.    Subjective Assessment - 10/21/19 2133    Subjective The pain is worse than it was before. tried exercises but none of it helps. Was not prescribed MRI. Chronic, excruciating, stabbing, radiating, throbbing, hot. pain to just below elbow and up to lower cervical region.  stops me from sleeping    Currently in Pain? Yes    Pain Score 10-Worst pain ever    Pain Location Shoulder    Pain Orientation Left    Pain Descriptors / Indicators --   see above   Aggravating Factors  constant aggravation    Pain Relieving Factors nothing found              [facility] PT Assessment - 10/21/19 0001      Assessment   Medical Diagnosis Lt shoulder pain    Referring Provider (PT) Dr Neomia Dear     Hand Dominance Right      Prior Function   Vocation Full time employment    Vocation Requirements cleaning business      Cognition   Overall Cognitive Status Within Functional Limits for tasks assessed      Observation/Other Assessments   Observations frequent movements and stretching neck    Focus on Therapeutic Outcomes (FOTO)  n/a MCD      Sensation   Additional Comments occasional N/T into hand, occasional HA       AROM   Overall AROM Comments full AROM avail- incr pain to demo      Strength   Overall Strength Comments gross 3/5      Palpation   Palpation comment palpation of shoulder girdle all painful but nothing specifically increased pain      Special Tests    Special Tests Rotator Cuff Impingement    Other special tests +pain abd full & empty can but able to hold against gravity, able to distinguish sharp/dull    Rotator Cuff Impingment tests --   + for supraspinatus passively     Spurling's   Comment reduced pain with compression & distraciton                      Objective measurements completed on examination: See above findings.               PT Education - 10/21/19 2135    Education Details see plan    Person(s) Educated Patient    Methods Explanation    Comprehension Verbalized understanding                     Plan -  10/21/19 2136    Clinical Impression Statement [name] returns to PT with a new referral again for treatment of her Left shoulder pain. I am familiar with this patient. At this time she has completed multiple episodes of physical therapy, reports she has been compliant with HEP but the pain is worse than before. 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 We discussed the possibility of pain management but I strongly encouraged her to consider counseling for chronic pain management. She said that the ortho thinks she has fibromyalgia so I educated her on this diagnosis. I advised that PT may be appropriate for her in the future but as she has tried multiple times and gotten worse, it is not appropriate right now. Pt verbalized agreement and I encouraged her to contact me with any further questions.    PT Frequency One time visit    Consulted and Agree with Plan of Care Patient            Visit Diagnosis: Chronic left shoulder pain - Plan: PT plan of care  cert/re-cert     Problem List Patient Active Problem List   Diagnosis Date Noted  . Normal delivery 10/02/2013  . Indication for care in labor or delivery 10/01/2013    Avrom Robarts C. Camika Marsico PT, DPT 10/21/19 9:45 PM   [facility] [address] [place], [place], [address] Phone: [phone]   Fax:  [phone]  Name: [name] MRN: [id number] Date of Birth: 06-25-1985

## 2019-11-06 ENCOUNTER — Emergency Department (HOSPITAL_COMMUNITY)
Admission: EM | Admit: 2019-11-06 | Discharge: 2019-11-06 | Disposition: A | Discharge status: Home / Self Care | Payer: Medicaid Other | Source: Home / Self Care | Attending: Emergency Medicine | Admitting: Emergency Medicine

## 2019-11-06 ENCOUNTER — Encounter (HOSPITAL_COMMUNITY): Payer: Self-pay

## 2019-11-06 ENCOUNTER — Other Ambulatory Visit: Payer: Self-pay

## 2019-11-06 ENCOUNTER — Emergency Department (HOSPITAL_COMMUNITY): Payer: Medicaid Other

## 2019-11-06 ENCOUNTER — Emergency Department (HOSPITAL_COMMUNITY)
Admission: EM | Admit: 2019-11-06 | Discharge: 2019-11-06 | Disposition: A | Discharge status: Home / Self Care | Payer: Medicaid Other | Attending: Emergency Medicine | Admitting: Emergency Medicine

## 2019-11-06 DIAGNOSIS — S299XXA Unspecified injury of thorax, initial encounter: Secondary | ICD-10-CM | POA: Diagnosis not present

## 2019-11-06 DIAGNOSIS — Y999 Unspecified external cause status: Secondary | ICD-10-CM | POA: Insufficient documentation

## 2019-11-06 DIAGNOSIS — R079 Chest pain, unspecified: Secondary | ICD-10-CM | POA: Insufficient documentation

## 2019-11-06 DIAGNOSIS — F17210 Nicotine dependence, cigarettes, uncomplicated: Secondary | ICD-10-CM | POA: Diagnosis not present

## 2019-11-06 DIAGNOSIS — R0602 Shortness of breath: Secondary | ICD-10-CM | POA: Diagnosis not present

## 2019-11-06 DIAGNOSIS — R0781 Pleurodynia: Secondary | ICD-10-CM | POA: Diagnosis not present

## 2019-11-06 DIAGNOSIS — Y9351 Activity, roller skating (inline) and skateboarding: Secondary | ICD-10-CM | POA: Insufficient documentation

## 2019-11-06 DIAGNOSIS — M542 Cervicalgia: Secondary | ICD-10-CM | POA: Insufficient documentation

## 2019-11-06 DIAGNOSIS — Y9289 Other specified places as the place of occurrence of the external cause: Secondary | ICD-10-CM | POA: Insufficient documentation

## 2019-11-06 MED ORDER — METHOCARBAMOL 500 MG PO TABS: 500.0000 mg | ORAL_TABLET | Freq: Two times a day (BID) | ORAL | 0 refills | Status: DC

## 2019-11-06 MED ORDER — NAPROXEN 375 MG PO TABS: 375.0000 mg | ORAL_TABLET | Freq: Two times a day (BID) | ORAL | 0 refills | Status: DC

## 2019-11-06 MED ORDER — OXYCODONE-ACETAMINOPHEN 5-325 MG PO TABS
1.0000 | ORAL_TABLET | Freq: Once | ORAL | Status: AC
  Administered 2019-11-06: 1 via ORAL
  Filled 2019-11-06: qty 1
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
## 2019-11-06 NOTE — Discharge Instructions (Signed)
Recommend anti-inflammatory, Tylenol and muscle relaxer for pain control.  If you develop worsening difficulty breathing, worsening chest pain, other new concerning symptom, return to ER for reassessment.  Recommend recheck with your primary doctor regarding your symptoms later this week.
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
## 2019-11-06 NOTE — ED Notes (Signed)
An After Visit Summary was printed and given to the patient. Discharge instructions given and no further questions at this time.  

## 2019-11-06 NOTE — ED Notes (Signed)
Pt went outside with boyfriend crying and he came inside asking why haven't we done anything for her pain and how long it would be and that she is just going to stay in the car. I explained that we'd done all we could out here and we were just waiting on rooms to open as well as why she couldn't sit out in the car due to her safety and while explaining he walked off cussing.

## 2019-11-07 NOTE — ED Provider Notes (Signed)
[facility]-EMERGENCY DEPT Provider Note   CSN: [id number] Arrival date & time: 11/06/19  1106     History Chief Complaint  Patient presents with   Fall   Rib Injury    [name] is a 34 y.o. female.  Presents to ER with concern for rib pain.  She reports that she fell off her hover board last night landing on her right side.  She denies loss of consciousness.  Did also hit her chin.  Pain is moderate to severe, worse with deep breaths.  No abdominal pain, flank pain, no back pain.  Does have dull ache in her neck.  Has not had any confusion, vomiting, nausea.  No numbness or weakness in her extremities.  Denies any medical problems.  HPI     History reviewed. No pertinent past medical history.  Patient Active Problem List   Diagnosis Date Noted   Normal delivery 10/02/2013   Indication for care in labor or delivery 10/01/2013    Past Surgical History:  Procedure Laterality Date   MANDIBLE FRACTURE SURGERY       OB History    Gravida  5   Para  3   Term  3   Preterm      AB  2   Living  3     SAB  0   TAB  2   Ectopic      Multiple      Live Births  3           No family history on file.  Social History   Tobacco Use   Smoking status: Current Every Day Smoker    Packs/day: 0.50    Types: Cigarettes   Smokeless tobacco: Never Used  Substance Use Topics   Alcohol use: Yes    Comment: occasional   Drug use: No    Home Medications Prior to Admission medications   Medication Sig Start Date End Date Taking? Authorizing Provider  diphenhydrAMINE (BENADRYL) 25 mg capsule Take 1 capsule (25 mg total) by mouth every 6 (six) hours as needed. Patient not taking: Reported on 08/18/2017 10/11/16   Georgiana Shore, PA-C  ibuprofen (ADVIL,MOTRIN) 600 MG tablet Take 1 tablet (600 mg total) by mouth every 6 (six) hours as needed. Patient not taking: Reported on 08/29/2018 03/21/18   Fayrene Helper, PA-C  methocarbamol  (ROBAXIN) 500 MG tablet Take 1 tablet (500 mg total) by mouth 2 (two) times daily. 11/06/19   Milagros Loll, MD  metroNIDAZOLE (FLAGYL) 500 MG tablet Take 1 tablet (500 mg total) by mouth 2 (two) times daily. Patient not taking: Reported on 08/29/2018 03/21/18   Fayrene Helper, PA-C  naproxen (NAPROSYN) 250 MG tablet Take 1 tablet (250 mg total) by mouth 2 (two) times daily with a meal. Patient not taking: Reported on 08/18/2017 11/03/16   Everlene Farrier, PA-C  naproxen (NAPROSYN) 375 MG tablet Take 1 tablet (375 mg total) by mouth 2 (two) times daily. 11/06/19   Milagros Loll, MD  sulfamethoxazole-trimethoprim (BACTRIM DS,SEPTRA DS) 800-160 MG tablet Take 1 tablet by mouth 2 (two) times daily. Patient not taking: Reported on 10/20/2017 08/18/17   Sharyon Cable, CNM  tinidazole [facility]) 500 MG tablet Take 2 tablets (1,000 mg total) by mouth daily with breakfast. Patient not taking: Reported on 08/29/2018 10/27/17   Brock Bad, MD  traMADol (ULTRAM-ER) 200 MG 24 hr tablet Take 200 mg by mouth daily.    [provider]  Allergies    Patient has no known allergies.  Review of Systems   Review of Systems  Constitutional: Negative for chills and fever.  HENT: Negative for ear pain and sore throat.   Eyes: Negative for pain and visual disturbance.  Respiratory: Positive for shortness of breath. Negative for cough.   Cardiovascular: Positive for chest pain. Negative for palpitations.  Gastrointestinal: Negative for abdominal pain and vomiting.  Genitourinary: Negative for dysuria and hematuria.  Musculoskeletal: Positive for neck pain. Negative for arthralgias and back pain.  Skin: Negative for color change and rash.  Neurological: Negative for seizures and syncope.  All other systems reviewed and are negative.   Physical Exam Updated Vital Signs BP (!) 112/98    Pulse 70    Temp 98.6 F (37 C) (Oral)    Resp 18    Ht 5\' 4"  (1.626 m)    Wt 62.6 kg    LMP 11/04/2019     SpO2 98%    BMI 23.69 kg/m   Physical Exam Vitals and nursing note reviewed.  Constitutional:      General: She is not in acute distress.    Appearance: She is well-developed.  HENT:     Head: Normocephalic.     Comments: Superficial abrasion to chin Eyes:     Conjunctiva/sclera: Conjunctivae normal.  Neck:     Comments: Some tenderness across base of neck, no midline bony tenderness over her C-spine, no step-off or deformity Cardiovascular:     Rate and Rhythm: Normal rate and regular rhythm.     Heart sounds: No murmur heard.   Pulmonary:     Effort: Pulmonary effort is normal. No respiratory distress.     Breath sounds: Normal breath sounds.     Comments: Tenderness to palpation of her right lateral chest wall Abdominal:     Palpations: Abdomen is soft.     Tenderness: There is no abdominal tenderness.  Musculoskeletal:     Comments: Back: no T, L spine TTP, no step off or deformity RUE: no TTP throughout, no deformity, normal joint ROM, radial pulse intact, distal sensation and motor intact LUE: no TTP throughout, no deformity, normal joint ROM, radial pulse intact, distal sensation and motor intact RLE:  no TTP throughout, no deformity, normal joint ROM, distal pulse, sensation and motor intact LLE: no TTP throughout, no deformity, normal joint ROM, distal pulse, sensation and motor intact  Skin:    General: Skin is warm and dry.  Neurological:     Mental Status: She is alert.     ED Results / Procedures / Treatments   Labs (all labs ordered are listed, but only abnormal results are displayed) Labs Reviewed - No data to display  EKG EKG Interpretation  Date/Time:  Tuesday November 06 2019 11:27:01 EDT Ventricular Rate:  58 PR Interval:    QRS Duration: 95 QT Interval:  397 QTC Calculation: 390 R Axis:   80 Text Interpretation: Sinus arrhythmia 12 Lead; Mason-Likar since last tracing no significant change Confirmed by 03-12-1976 [id number]) on 11/06/2019  11:43:38 AM   Radiology DG Chest 2 View  Result Date: 11/06/2019 CLINICAL DATA:  Chest pain, shortness of breath fall off hover board. EXAM: CHEST - 2 VIEW COMPARISON:  11/06/2019 at 12:33 a.m. FINDINGS: Trachea midline. Cardiomediastinal contours and hilar structures are normal. Lungs are clear. No acute skeletal process on limited assessment. IMPRESSION: No acute cardiopulmonary disease. Electronically Signed   By: 01/06/2020 M.D.   On: 11/06/2019 12:02  DG Ribs Unilateral W/Chest Right  Result Date: 11/06/2019 CLINICAL DATA:  Chest pain EXAM: RIGHT RIBS AND CHEST - 3+ VIEW COMPARISON:  08/05/2014 FINDINGS: No fracture or other bone lesions are seen involving the ribs. There is no evidence of pneumothorax or pleural effusion. Both lungs are clear. Heart size and mediastinal contours are within normal limits. IMPRESSION: Negative. Electronically Signed   By: Deatra Robinson M.D.   On: 11/06/2019 00:48   DG Cervical Spine Complete  Result Date: 11/06/2019 CLINICAL DATA:  Pain after fall. EXAM: CERVICAL SPINE - COMPLETE 4+ VIEW COMPARISON:  05/08/2019 MRI cervical spine. FINDINGS: There is no evidence of cervical spine fracture or prevertebral soft tissue swelling. Straightening of cervical lordosis. No listhesis. Mild multilevel degenerative changes including endplate sclerosis, osteophytosis and mild disc space loss most prominent at the C5-7 levels. IMPRESSION: No acute osseous abnormality. Mild spondylosis most prominent at the C5-7 levels. Electronically Signed   By: Stana Bunting M.D.   On: 11/06/2019 14:48    Procedures Procedures (including critical care time)  Medications Ordered in ED Medications  oxyCODONE-acetaminophen (PERCOCET/ROXICET) 5-325 MG per tablet 1 tablet (1 tablet Oral Given 11/06/19 1420)    ED Course  I have reviewed the triage vital signs and the nursing notes.  Pertinent labs & imaging results that were available during my care of the patient were reviewed  by me and considered in my medical decision making (see chart for details).    MDM Rules/Calculators/A&P                         34 year old lady presents to ER after fall from hover board with chief complaint of right rib pain.  Noted some tenderness over her right lateral chest wall as well as superficial abrasion to chin and some mild tenderness over her neck.  Plain films of chest x-ray and C-spine were negative.  Patient is well-appearing with stable vital signs.  Believe she is appropriate for outpatient management.  Provided NSAIDs, muscle relaxer.  After the discussed management above, the patient was determined to be safe for discharge.  The patient was in agreement with this plan and all questions regarding their care were answered.  ED return precautions were discussed and the patient will return to the ED with any significant worsening of condition.   Final Clinical Impression(s) / ED Diagnoses Final diagnoses:  Rib pain on right side    Rx / DC Orders ED Discharge Orders         Ordered    methocarbamol (ROBAXIN) 500 MG tablet  2 times daily        11/06/19 1457    naproxen (NAPROSYN) 375 MG tablet  2 times daily        11/06/19 1457           Milagros Loll, MD 11/07/19 1534

## 2019-12-08 ENCOUNTER — Emergency Department (HOSPITAL_COMMUNITY): Payer: Medicaid Other

## 2019-12-08 ENCOUNTER — Encounter (HOSPITAL_COMMUNITY): Payer: Self-pay | Admitting: Emergency Medicine

## 2019-12-08 ENCOUNTER — Other Ambulatory Visit: Payer: Self-pay

## 2019-12-08 ENCOUNTER — Emergency Department (HOSPITAL_COMMUNITY)
Admission: EM | Admit: 2019-12-08 | Discharge: 2019-12-08 | Disposition: A | Discharge status: Home / Self Care | Payer: Medicaid Other | Attending: Emergency Medicine | Admitting: Emergency Medicine

## 2019-12-08 DIAGNOSIS — F17210 Nicotine dependence, cigarettes, uncomplicated: Secondary | ICD-10-CM | POA: Diagnosis not present

## 2019-12-08 DIAGNOSIS — M79671 Pain in right foot: Secondary | ICD-10-CM | POA: Insufficient documentation

## 2019-12-08 DIAGNOSIS — Y92511 Restaurant or cafe as the place of occurrence of the external cause: Secondary | ICD-10-CM | POA: Diagnosis not present

## 2019-12-08 DIAGNOSIS — W108XXA Fall (on) (from) other stairs and steps, initial encounter: Secondary | ICD-10-CM | POA: Insufficient documentation

## 2019-12-08 DIAGNOSIS — M549 Dorsalgia, unspecified: Secondary | ICD-10-CM | POA: Insufficient documentation

## 2019-12-08 DIAGNOSIS — S5011XA Contusion of right forearm, initial encounter: Secondary | ICD-10-CM | POA: Insufficient documentation

## 2019-12-08 DIAGNOSIS — R0781 Pleurodynia: Secondary | ICD-10-CM | POA: Insufficient documentation

## 2019-12-08 DIAGNOSIS — M79604 Pain in right leg: Secondary | ICD-10-CM | POA: Diagnosis not present

## 2019-12-08 DIAGNOSIS — S59911A Unspecified injury of right forearm, initial encounter: Secondary | ICD-10-CM | POA: Diagnosis present

## 2019-12-08 DIAGNOSIS — W19XXXA Unspecified fall, initial encounter: Secondary | ICD-10-CM

## 2019-12-08 LAB — POC URINE PREG, ED: Preg Test, Ur: NEGATIVE

## 2019-12-08 MED ORDER — LIDOCAINE 5 % EX PTCH: 1.0000 | MEDICATED_PATCH | CUTANEOUS | 0 refills | Status: AC

## 2019-12-08 MED ORDER — ACETAMINOPHEN 500 MG PO TABS
1000.0000 mg | ORAL_TABLET | Freq: Once | ORAL | Status: AC
  Administered 2019-12-08: 1000 mg via ORAL
  Filled 2019-12-08: qty 2

## 2019-12-08 MED ORDER — NAPROXEN 500 MG PO TABS: 500.0000 mg | ORAL_TABLET | Freq: Two times a day (BID) | ORAL | 0 refills | Status: AC

## 2019-12-08 MED ORDER — METHOCARBAMOL 500 MG PO TABS: 500.0000 mg | ORAL_TABLET | Freq: Two times a day (BID) | ORAL | 0 refills | Status: AC

## 2019-12-08 NOTE — ED Triage Notes (Signed)
Pt reports fell down three steps at [employer] last night landing on her back and right side. Pt has bruising to right forearm, pain in lower back, right leg and foot.

## 2019-12-08 NOTE — ED Provider Notes (Signed)
[facility]-EMERGENCY DEPT Provider Note   CSN: [id number] Arrival date & time: 12/08/19  8841    History Chief Complaint  Patient presents with  . Fall    [name] is a 34 y.o. female with no significant past medica history who presents for evaluation of fall. Mechanical fall yesterday while leaving restaurant. Was able to ambulate after fall however needed help up. Denies hitting head, LOC, anticoagulation. Pain to right posterior ribs, lower back. Right foot. Feel like her right foot is swollen. Has not taken anything for pain. Rates her pain a 9/10.  Denies headache, lightheadedness, dizziness, chest pain, shortness of breath, abdominal pain, lower extremity edema, erythema or warmth, paresthesias or weakness.  No contusions or abrasions.  She does have bruise to right forearm. Ambulatory from WR to treatment room without difficulty  Denies additional aggravating or alleviating factors.  History obtained from patient and past medical records. No interpretor was used.  HPI     History reviewed. No pertinent past medical history.  Patient Active Problem List   Diagnosis Date Noted  . Normal delivery 10/02/2013  . Indication for care in labor or delivery 10/01/2013    Past Surgical History:  Procedure Laterality Date  . MANDIBLE FRACTURE SURGERY       OB History    Gravida  5   Para  3   Term  3   Preterm      AB  2   Living  3     SAB  0   TAB  2   Ectopic      Multiple      Live Births  3           No family history on file.  Social History   Tobacco Use  . Smoking status: Current Every Day Smoker    Packs/day: 0.50    Types: Cigarettes  . Smokeless tobacco: Never Used  Substance Use Topics  . Alcohol use: Yes    Comment: occasional  . Drug use: No    Home Medications Prior to Admission medications   Medication Sig Start Date End Date Taking? Authorizing Provider  diphenhydrAMINE (BENADRYL) 25 mg capsule  Take 1 capsule (25 mg total) by mouth every 6 (six) hours as needed. Patient not taking: Reported on 08/18/2017 10/11/16   Georgiana Shore, PA-C  ibuprofen (ADVIL,MOTRIN) 600 MG tablet Take 1 tablet (600 mg total) by mouth every 6 (six) hours as needed. Patient not taking: Reported on 08/29/2018 03/21/18   Fayrene Helper, PA-C  lidocaine (LIDODERM) 5 % Place 1 patch onto the skin daily. Remove & Discard patch within 12 hours or as directed by MD 12/08/19   Hazely Sealey A, PA-C  methocarbamol (ROBAXIN) 500 MG tablet Take 1 tablet (500 mg total) by mouth 2 (two) times daily. 12/08/19   Drexler Maland A, PA-C  metroNIDAZOLE (FLAGYL) 500 MG tablet Take 1 tablet (500 mg total) by mouth 2 (two) times daily. Patient not taking: Reported on 08/29/2018 03/21/18   Fayrene Helper, PA-C  naproxen (NAPROSYN) 500 MG tablet Take 1 tablet (500 mg total) by mouth 2 (two) times daily. 12/08/19   Stacye Noori A, PA-C  sulfamethoxazole-trimethoprim (BACTRIM DS,SEPTRA DS) 800-160 MG tablet Take 1 tablet by mouth 2 (two) times daily. Patient not taking: Reported on 10/20/2017 08/18/17   Sharyon Cable, CNM  tinidazole (TINDAMAX) 500 MG tablet Take 2 tablets (1,000 mg total) by mouth daily with breakfast. Patient not taking: Reported  on 08/29/2018 10/27/17   Brock Bad, MD  traMADol (ULTRAM-ER) 200 MG 24 hr tablet Take 200 mg by mouth daily.    [provider]    Allergies    Patient has no known allergies.  Review of Systems   Review of Systems  Constitutional: Negative.   HENT: Negative.   Respiratory: Negative.   Cardiovascular: Negative.   Gastrointestinal: Negative.   Genitourinary: Negative.   Musculoskeletal: Positive for back pain. Negative for arthralgias, joint swelling, myalgias, neck pain and neck stiffness.       Right foot, right forearm, right posterior rib pain, lumbar back pain  Skin: Negative.        Ecchymosis to right ulnar aspect forearm  Neurological: Negative.   All  other systems reviewed and are negative.   Physical Exam Updated Vital Signs BP 94/84   Pulse (!) 51   Temp 98.3 F (36.8 C)   Resp 20   SpO2 98%   Physical Exam  Physical Exam  Constitutional: Pt appears well-developed and well-nourished. No distress.  HENT:  Head: Normocephalic and atraumatic.  Mouth/Throat: Oropharynx is clear and moist. No oropharyngeal exudate.  Eyes: Conjunctivae are normal.  Neck: Normal range of motion. Neck supple.  Full ROM without pain  No midline cervical pain BL trapezius tenderness to palpation Cardiovascular: Normal rate, regular rhythm and intact distal pulses.   Pulmonary/Chest: Effort normal and breath sounds normal. No respiratory distress. Pt has no wheezes.  Abdominal: Soft. Pt exhibits no distension. There is no tenderness, rebound or guarding. No abd bruit or pulsatile mass Musculoskeletal:  Full range of motion of the T-spine and L-spine with flexion, hyperextension, and lateral flexion. No midline tenderness or stepoffs. No tenderness to palpation of the spinous processes of the T-spine or L-spine. Mild tenderness to palpation of the paraspinous muscles of the RIGHT L-spine.negative  straight leg raise. Tenderness to dorsal aspect right foot.  Wiggles toes without difficulty.  Able to plantarflex and dorsiflex without difficulty.  Moves all 4 extremities with full range of motion without difficulty. Lymphadenopathy:    Pt has no cervical adenopathy.  Neurological: Pt is alert. Pt has normal reflexes.  Reflex Scores:      Bicep reflexes are 2+ on the right side and 2+ on the left side.      Brachioradialis reflexes are 2+ on the right side and 2+ on the left side.      Patellar reflexes are 2+ on the right side and 2+ on the left side.      Achilles reflexes are 2+ on the right side and 2+ on the left side. Speech is clear and goal oriented, follows commands Normal 5/5 strength in upper and lower extremities bilaterally including  dorsiflexion and plantar flexion, strong and equal grip strength Sensation normal to light and sharp touch Moves extremities without ataxia, coordination intact Normal gait Normal balance No Clonus Skin: Skin is warm and dry. No rash noted or lesions noted. Pt is not diaphoretic. No erythema, ecchymosis,edema or warmth.  Psychiatric: Pt has a normal mood and affect. Behavior is normal.  Nursing note and vitals reviewed. ED Results / Procedures / Treatments   Labs (all labs ordered are listed, but only abnormal results are displayed) Labs Reviewed  POC URINE PREG, ED    EKG None  Radiology DG Ribs Unilateral W/Chest Right  Result Date: 12/08/2019 CLINICAL DATA:  Patient status post fall down stairs. Right ribcage pain. EXAM: RIGHT RIBS AND CHEST - 3+ VIEW  COMPARISON:  Chest radiograph November 06, 2019. FINDINGS: Normal cardiac and mediastinal contours. No consolidative pulmonary opacities. No pleural effusion or pneumothorax. No evidence for acute displaced rib fracture. IMPRESSION: No acute cardiopulmonary process. Electronically Signed   By: Annia Beltrew  Davis M.D.   On: 12/08/2019 12:18   DG Lumbar Spine Complete  Result Date: 12/08/2019 CLINICAL DATA:  Low back pain after falling last night. EXAM: LUMBAR SPINE - COMPLETE 4+ VIEW COMPARISON:  Lumbar spine radiographs 04/29/2012. Concurrent chest radiographs. FINDINGS: Transitional lumbosacral anatomy. As correlated with chest radiographs, there are 12 rib-bearing thoracic type vertebral bodies and a transitional, largely lumbarized S1 segment. The alignment is normal. There is no evidence of acute fracture or pars defect. Mild disc space narrowing is present at L5-S1. IMPRESSION: No acute osseous findings or malalignment. Mild disc space narrowing at L5-S1. Electronically Signed   By: Carey BullocksWilliam  Veazey M.D.   On: 12/08/2019 12:20   DG Forearm Right  Result Date: 12/08/2019 CLINICAL DATA:  Proximal forearm pain and swelling since falling down  steps last night. EXAM: RIGHT FOREARM - 2 VIEW COMPARISON:  None. FINDINGS: The mineralization and alignment are normal. There is no evidence of acute fracture or dislocation. The joint spaces are preserved. Possible mild dorsal soft tissue swelling. No foreign body or elbow joint effusion identified. IMPRESSION: No acute osseous findings. Electronically Signed   By: Carey BullocksWilliam  Veazey M.D.   On: 12/08/2019 12:23   DG Foot Complete Right  Result Date: 12/08/2019 CLINICAL DATA:  Foot pain after falling down steps last night. EXAM: RIGHT FOOT COMPLETE - 3+ VIEW COMPARISON:  None. FINDINGS: The mineralization and alignment are normal. There is no evidence of acute fracture or dislocation. The joint spaces are preserved. No focal soft tissue swelling or foreign body identified. IMPRESSION: Normal examination. Electronically Signed   By: Carey BullocksWilliam  Veazey M.D.   On: 12/08/2019 12:21    Procedures Procedures (including critical care time)  Medications Ordered in ED Medications  acetaminophen (TYLENOL) tablet 1,000 mg (1,000 mg Oral Given 12/08/19 1020)    ED Course  I have reviewed the triage vital signs and the nursing notes.  Pertinent labs & imaging results that were available during my care of the patient were reviewed by me and considered in my medical decision making (see chart for details).  34 year old with mechanical fall which occurred yesterday.  She is afebrile, nonseptic, non-ill-appearing.  Denies any head, LOC or any coagulation.  No lacerations, breaks in skin.  Bilateral trapezius tenderness however no midline cervical tenderness.  C-spine cleared by Nexus criteria.  Heart and lungs clear.  Abdomen soft, nontender.  Pelvis stable, nontender palpation.  No evidence of acute intracranial abnormality.  Mild tenderness to right posterior lower ribs however no crepitus or step-offs.  Equal rise and fall to chest.  She feels like her right foot is swollen however no appreciable edema on exam.  She  is neurovascularly intact.  She has neuromusculoskeletal exam aside from some generalized tenderness to dorsal aspect right foot.  She has 7 cm area of ecchymosis to her ulnar aspect right forearm however no underlying bony tenderness.  Ambulatory in ED without difficulty. Plan on imaging and reassess.  Imaging personally reviewed and interpreted:  Imaging without any acute fracture, dislocation or effusion  Patient able to ambulate without difficulty.  No obvious abnormalities on exam with reassuring imaging.  She is eating spicy Cheetos on reevaluation to the room and drinking [facility].  No acute distress.  Discussed symptomatic management  at home.  She will return for any worsening symptoms.  The patient has been appropriately medically screened and/or stabilized in the ED. I have low suspicion for any other emergent medical condition which would require further screening, evaluation or treatment in the ED or require inpatient management.  Patient is hemodynamically stable and in no acute distress.  Patient able to ambulate in department prior to ED.  Evaluation does not show acute pathology that would require ongoing or additional emergent interventions while in the emergency department or further inpatient treatment.  I have discussed the diagnosis with the patient and answered all questions.  Pain is been managed while in the emergency department and patient has no further complaints prior to discharge.  Patient is comfortable with plan discussed in room and is stable for discharge at this time.  I have discussed strict return precautions for returning to the emergency department.  Patient was encouraged to follow-up with PCP/specialist refer to at discharge.     MDM Rules/Calculators/A&P                           Final Clinical Impression(s) / ED Diagnoses Final diagnoses:  Fall, initial encounter    Rx / DC Orders ED Discharge Orders         Ordered    naproxen (NAPROSYN) 500 MG  tablet  2 times daily        12/08/19 1249    methocarbamol (ROBAXIN) 500 MG tablet  2 times daily        12/08/19 1249    lidocaine (LIDODERM) 5 %  Every 24 hours        12/08/19 1249           Hollis Tuller A, PA-C 12/08/19 1249    Tegeler, Canary Brim, MD 12/08/19 1344

## 2019-12-08 NOTE — Discharge Instructions (Signed)
Take the medications as prescribed  Return for new or worsening symptoms 

## 2020-03-18 ENCOUNTER — Other Ambulatory Visit: Payer: Self-pay | Admitting: Internal Medicine

## 2020-03-20 LAB — SARS-COV-2 RNA,(COVID-19) QUALITATIVE NAAT: SARS CoV2 RNA: NOT DETECTED

## 2020-04-25 ENCOUNTER — Ambulatory Visit: Payer: Medicaid Other

## 2020-06-26 ENCOUNTER — Ambulatory Visit: Payer: Medicaid Other | Attending: Internal Medicine | Admitting: Physical Therapy

## 2020-06-26 ENCOUNTER — Other Ambulatory Visit: Payer: Self-pay

## 2020-06-26 DIAGNOSIS — R293 Abnormal posture: Secondary | ICD-10-CM | POA: Insufficient documentation

## 2020-06-26 DIAGNOSIS — R252 Cramp and spasm: Secondary | ICD-10-CM | POA: Diagnosis present

## 2020-06-26 DIAGNOSIS — M25512 Pain in left shoulder: Secondary | ICD-10-CM | POA: Diagnosis present

## 2020-06-26 DIAGNOSIS — G8929 Other chronic pain: Secondary | ICD-10-CM | POA: Insufficient documentation

## 2020-06-26 NOTE — Patient Instructions (Signed)
Access Code: [email or web address] URL: [email or web address] Date: 06/26/2020 Prepared by: Karie Mainland  Exercises Shoulder Extension with Resistance - 2 x daily - 7 x weekly - 2 sets - 10 reps - 5 hold Standing Bilateral Low Shoulder Row with Anchored Resistance - 2 x daily - 7 x weekly - 2 sets - 10 reps - 5 hold Scapular Protraction at Wall - 2 x daily - 7 x weekly - 2 sets - 10 reps - 10 hold Shoulder External Rotation and Scapular Retraction with Resistance - 2 x daily - 7 x weekly - 2 sets - 10 reps - 5 hold
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
## 2020-07-11 ENCOUNTER — Ambulatory Visit: Payer: Medicaid Other | Admitting: Physical Therapy

## 2020-07-13 IMAGING — CR DG SHOULDER 2+V*L*
3 series · 3 of 3 positions shown · non-contrast
Comparison: Chest radiograph from 08/05/2014

CLINICAL DATA: Left shoulder pain

EXAM:
LEFT SHOULDER - 2+ VIEW

[shoulder grashey]
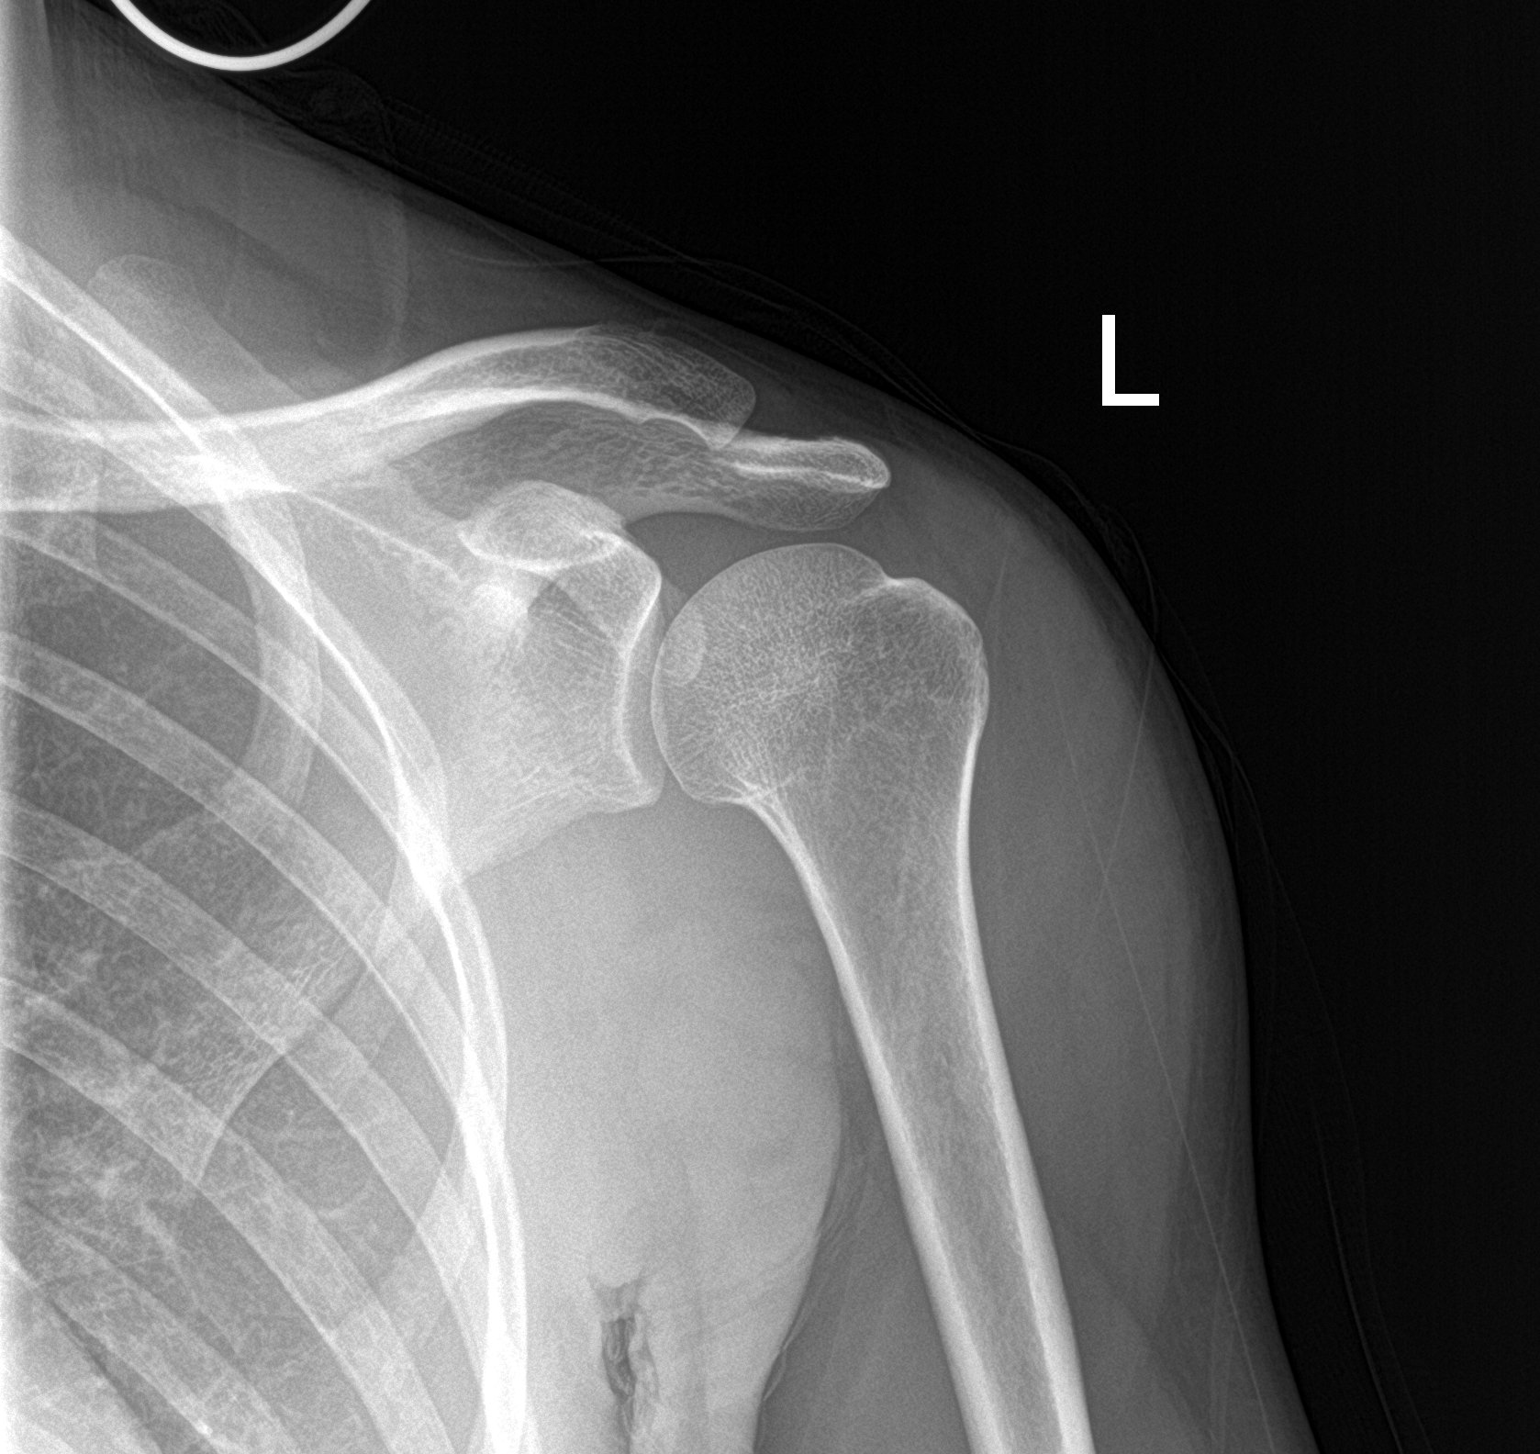

[shoulder y view]
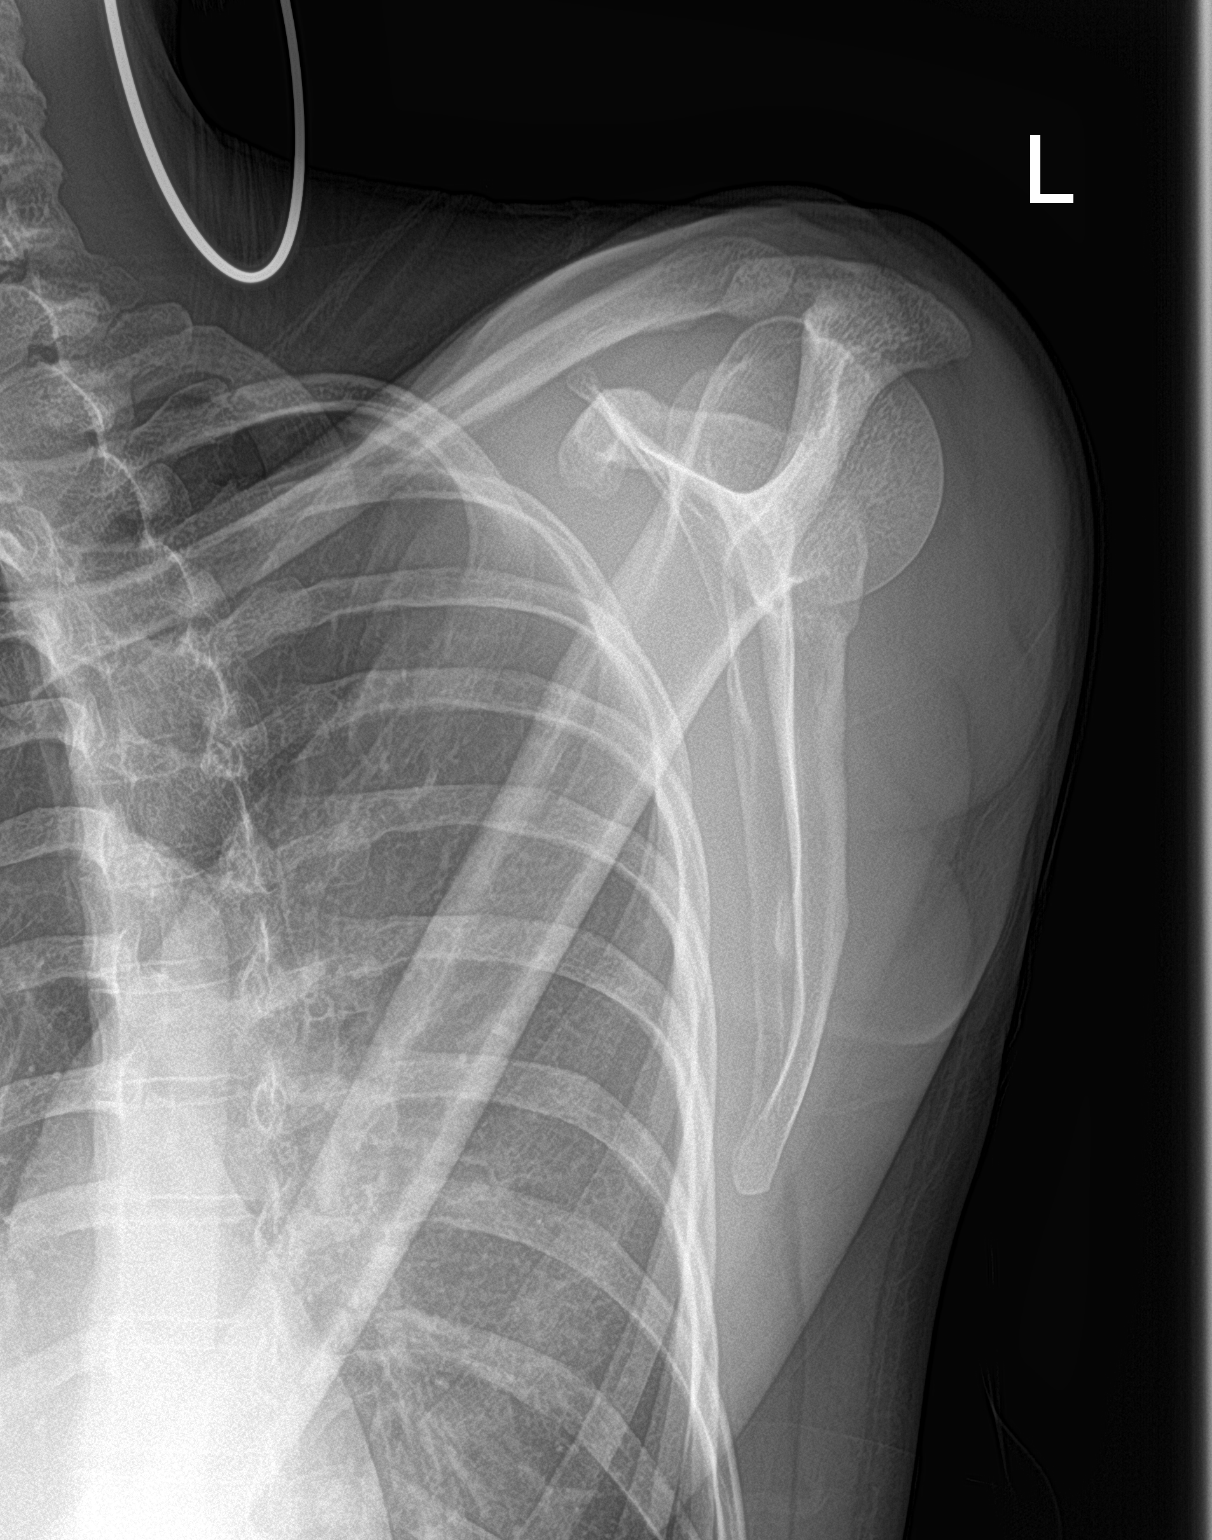

[shoulder axillary]
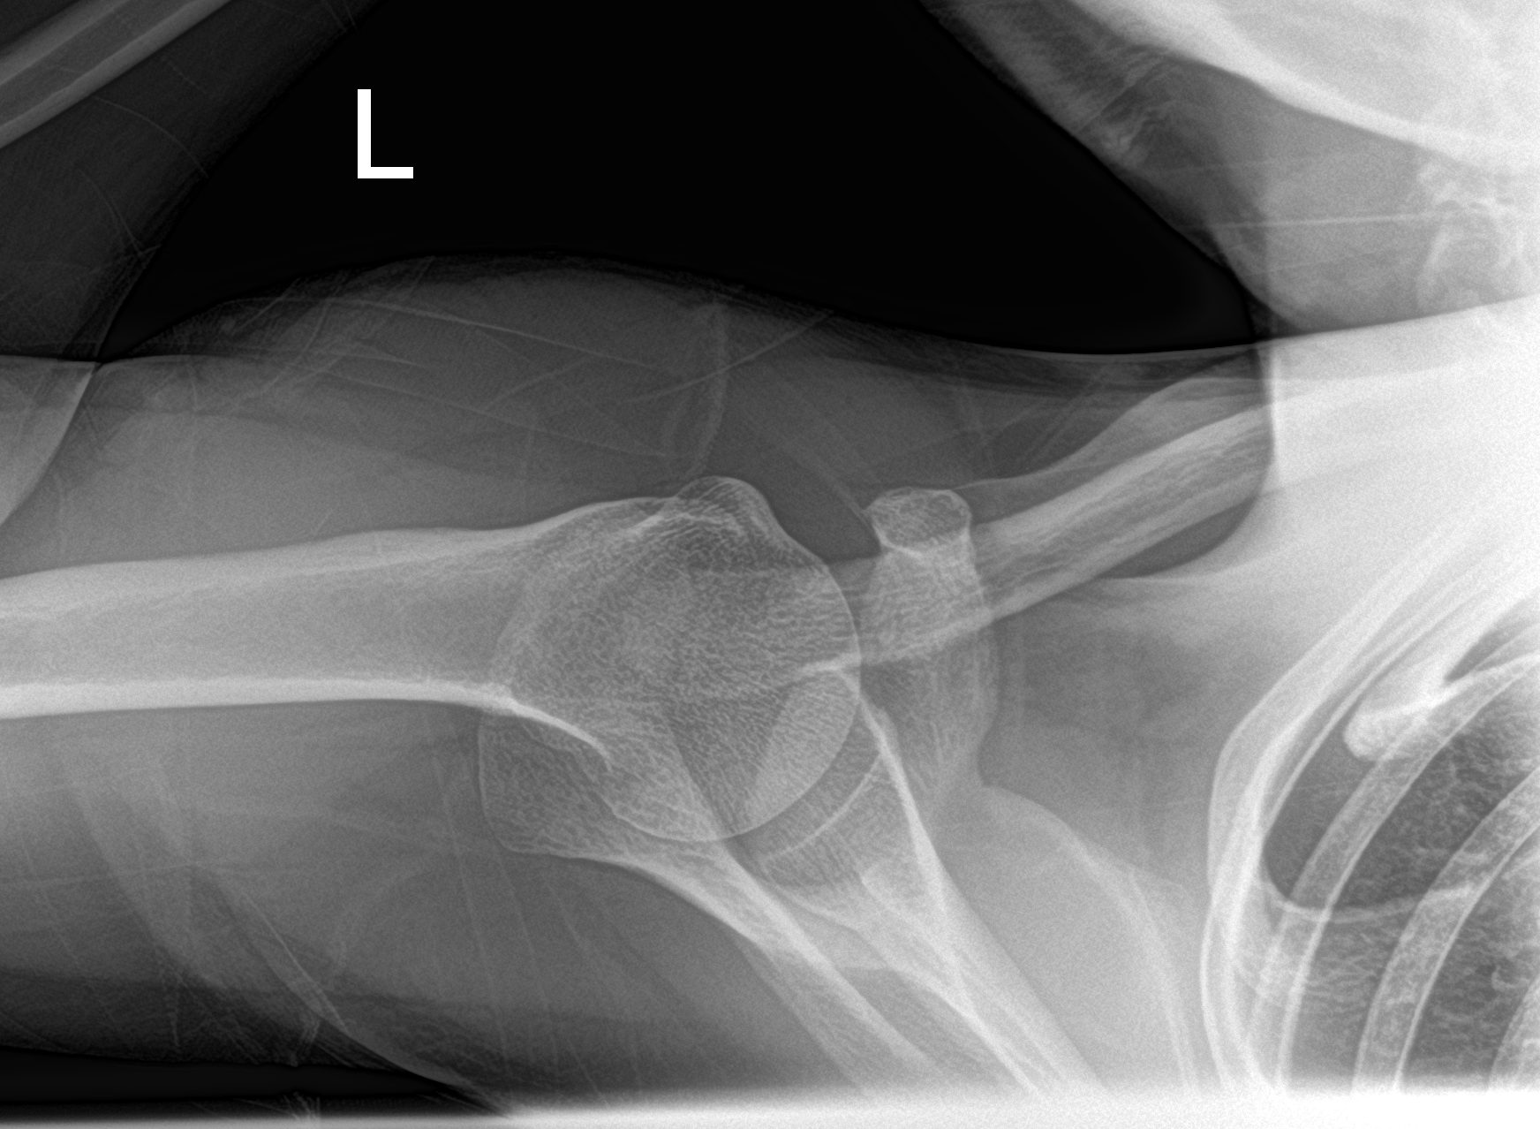

[3 of 3 positions shown; findings below may reference images not displayed]

FINDINGS: There is no evidence of fracture or dislocation. There is no
evidence of arthropathy or other focal bone abnormality. Subacromial
morphology is type 2 (curved).
IMPRESSION: Negative.

## 2020-07-17 ENCOUNTER — Ambulatory Visit: Payer: Medicaid Other | Attending: Internal Medicine | Admitting: Physical Therapy

## 2020-07-17 ENCOUNTER — Telehealth: Payer: Self-pay | Admitting: Physical Therapy

## 2020-07-17 NOTE — Telephone Encounter (Signed)
Called patient regarding her missed appt today at 7:45 am.  I was unable to leave a voicemail on her mobile and her home number was busy.  I left her last/next appt on for 07/24/20.  This is her 1st no show and has had 1 other cancel since her eval about 3 weeks ago.   Karie Mainland, PT 07/17/20 8:19 AM Phone: [phone] Fax: [phone]

## 2020-07-24 ENCOUNTER — Ambulatory Visit: Payer: Medicaid Other | Admitting: Physical Therapy

## 2020-11-18 ENCOUNTER — Ambulatory Visit: Payer: Medicaid Other | Attending: Internal Medicine

## 2020-11-18 ENCOUNTER — Other Ambulatory Visit: Payer: Self-pay

## 2020-11-18 DIAGNOSIS — M6281 Muscle weakness (generalized): Secondary | ICD-10-CM | POA: Insufficient documentation

## 2020-11-18 DIAGNOSIS — M25512 Pain in left shoulder: Secondary | ICD-10-CM | POA: Diagnosis not present

## 2020-11-18 DIAGNOSIS — G8929 Other chronic pain: Secondary | ICD-10-CM | POA: Diagnosis present

## 2020-11-18 DIAGNOSIS — M25612 Stiffness of left shoulder, not elsewhere classified: Secondary | ICD-10-CM | POA: Insufficient documentation

## 2020-11-19 NOTE — Therapy (Addendum)
[facility] [address] [place], [place], [address] Phone: [phone]   Fax:  [phone]  Physical Therapy Evaluation/Discharge  Patient Details  Name: [name] MRN: [id number] Date of Birth: Nov 16, 1985 Referring Provider (PT): Fleet Contras, MD   Encounter Date: 11/18/2020   PT End of Session - 11/19/20 1428     Visit Number 1    Number of Visits 13    Date for PT Re-Evaluation 01/14/21    Authorization Type MCD - [facility]    PT Start Time 1750   arrived late   PT Stop Time 1818    PT Time Calculation (min) 28 min             History reviewed. No pertinent past medical history.  Past Surgical History:  Procedure Laterality Date   MANDIBLE FRACTURE SURGERY      There were no vitals filed for this visit.    Subjective Assessment - 11/18/20 1756     Subjective Pt presents to PT with reports of chronic L shoulder pain with radiation down L UE. Pt is well known to clinic and has had some success with PT in the past. Confirms occasional tingling sensation down L UE when pain his greatest. 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 No recent MOI or trauma, similar chronic pain that has been bothering her for last two years.    Pertinent History Pt presents to PT with reports of chronic L shoulder pain with radiation down L UE for last two years    Limitations Lifting    Patient Stated Goals Pt wants to improve strength in order to decrease pain and improve functional ability of L UE    Currently in Pain? Yes    Pain Score 7     Pain Location Shoulder    Pain Orientation Left;Posterior    Pain Descriptors / Indicators Sharp    Pain Type Chronic pain    Pain Radiating Towards L mid arm    Pain Onset More than a month ago    Pain Frequency Constant    Aggravating Factors  work, reaching, lifting    Pain Relieving Factors tennis ball  massage                [facility] PT Assessment - 11/19/20 0001       Assessment   Medical Diagnosis chronic pain in L shoulder    Referring Provider (PT) Fleet Contras, MD    Hand Dominance Right    Prior Therapy yes - for same issue      Precautions   Precautions None      Restrictions   Weight Bearing Restrictions No      Balance Screen   Has the patient fallen in the past 6 months No    Has the patient had a decrease in activity level because of a fear of falling?  No    Is the patient reluctant to leave their home because of a fear of falling?  No      Home Environment   Living Environment Private residence    Type of Home Apartment    Home Layout Two level    Additional Comments no barriers      Observation/Other Assessments   Focus on Therapeutic Outcomes (FOTO)  No FOTO - MCD    Other Surveys  Quick Dash    Quick DASH  93% disabtiliy  AROM   Left Shoulder Flexion 95 Degrees    Left Shoulder ABduction 82 Degrees    Left Shoulder Internal Rotation --   [facility]   Left Shoulder External Rotation --   [facility]     Strength   Overall Strength Comments L shoulder grossly 3+/5      Palpation   Palpation comment TTP to L subscapularis; easily moveable L scpula                        Objective measurements completed on examination: See above findings.                PT Education - 11/19/20 1427     Education Details eval findings, HEP, POC    Person(s) Educated Patient    Methods Explanation;Demonstration;Handout    Comprehension Verbalized understanding;Returned demonstration              PT Short Term Goals - 11/19/20 1429       PT SHORT TERM GOAL #1   Title She will be independent with initial HEP    Baseline initial HEP given    Time 3    Period Weeks    Status New    Target Date 12/10/20               PT Long Term Goals - 11/19/20 1429       PT LONG TERM GOAL #1   Title Pt will decrease Quick DASH to no  greater tahn 70% as proxy for functional improvement    Baseline 94% dissability    Time 8    Period Weeks    Status New    Target Date 01/14/21      PT LONG TERM GOAL #2   Title Pt will increase strength in L UE to 4/5 with MMT to show increased strength and stability    Baseline pain, 3+/5    Time 8    Period Weeks    Status New    Target Date 01/14/21      PT LONG TERM GOAL #3   Title Pt will be able to reach into cabinets not limited by pain for improved comfort and function    Baseline unable    Time 8    Period Weeks    Status New    Target Date 01/14/21                    Plan - 11/19/20 1433     Clinical Impression Statement Pt is a 35 y/o F who presents to PT with reports of chronic L shoulder pain. Physical findings are consistent with MD impession, with pt demo of decreased L shoulder ROM, pain with palpation, and decreased L UE strength. Her Quick DASH score indicates severe disability and shows she is operating below baseline PLOF. She would benefit from skilled PT services working on improving periscapular and RTC strength for improved functional ability and decreasing pain.    Personal Factors and Comorbidities Past/Current Experience;Finances    Examination-Activity Limitations Technical sales engineer;Toileting    Examination-Participation Restrictions Yard Work;Occupation;Community Activity;Driving;Volunteer    Stability/Clinical Decision Making Stable/Uncomplicated    Clinical Decision Making Low    Rehab Potential Good    PT Frequency --   1-2x/wk   PT Duration 8 weeks    PT Treatment/Interventions ADLs/Self Care Home Management;Electrical Stimulation;Cryotherapy;Moist Heat;Functional mobility training;Therapeutic exercise;Therapeutic activities;Neuromuscular re-education;Manual techniques;Passive range of motion;Dry needling;Taping;Vasopneumatic Device    PT Next  Visit Plan assess response to HEP, progress as able    PT Home Exercise Plan  Access Code: [email or web address]    Consulted and Agree with Plan of Care Patient             Patient will benefit from skilled therapeutic intervention in order to improve the following deficits and impairments:  Decreased endurance, Decreased activity tolerance, Decreased mobility, Decreased range of motion, Decreased strength, Impaired UE functional use, Pain  Visit Diagnosis: Chronic left shoulder pain - Plan: PT plan of care cert/re-cert  Muscle weakness (generalized) - Plan: PT plan of care cert/re-cert  Stiffness of left shoulder, not elsewhere classified - Plan: PT plan of care cert/re-cert     Problem List Patient Active Problem List   Diagnosis Date Noted   Normal delivery 10/02/2013   Indication for care in labor or delivery 10/01/2013    Eloy End, PT 11/19/2020, 2:39 PM  [facility] [address] [place], [place], [address] Phone: [phone]   Fax:  [phone]  Name: [name] MRN: [id number] Date of Birth: 1985/12/14  Check all possible CPT codes: 97110- Therapeutic Exercise, [phone]- Neuro Re-education, [phone] - Gait Training, [phone] - Manual Therapy, [phone] - Therapeutic Activities, and [phone] - Self Care      PHYSICAL THERAPY DISCHARGE SUMMARY  Visits from Start of Care: 1  Current functional level related to goals / functional outcomes: Unable to assess   Remaining deficits: Unable to assess   Education / Equipment: HEP   Patient agrees to discharge. Patient goals were  unable to assess . Patient is being discharged due to not returning since the last visit.

## 2021-01-13 ENCOUNTER — Ambulatory Visit: Payer: Medicaid Other | Admitting: Physical Therapy

## 2021-01-14 ENCOUNTER — Ambulatory Visit: Payer: Medicaid Other | Attending: Internal Medicine

## 2021-01-15 ENCOUNTER — Telehealth: Payer: Self-pay

## 2021-01-15 NOTE — Telephone Encounter (Signed)
PT called and attempted to talk to patient or leave voicemail, but was unable to.   Patient no-showed for re-evaluation after failing to return for follow-up visits to therapy after evaluation on 11/18/20.   At this point, PT believes it is appropriate that patient would need to receive a new referral from MD in order to restart therapy, as she was already formally discharged due to attendance policy previously.  Eloy End, PT, DPT 01/15/21 2:32 PM

## 2021-01-20 ENCOUNTER — Ambulatory Visit: Payer: Medicaid Other

## 2021-01-21 ENCOUNTER — Ambulatory Visit: Payer: Medicaid Other

## 2021-03-05 ENCOUNTER — Other Ambulatory Visit: Payer: Self-pay

## 2021-03-05 ENCOUNTER — Ambulatory Visit: Payer: Medicaid Other | Attending: Internal Medicine

## 2021-03-05 DIAGNOSIS — G8929 Other chronic pain: Secondary | ICD-10-CM | POA: Diagnosis present

## 2021-03-05 DIAGNOSIS — M6281 Muscle weakness (generalized): Secondary | ICD-10-CM | POA: Insufficient documentation

## 2021-03-05 DIAGNOSIS — M25512 Pain in left shoulder: Secondary | ICD-10-CM | POA: Insufficient documentation

## 2021-03-05 DIAGNOSIS — M25612 Stiffness of left shoulder, not elsewhere classified: Secondary | ICD-10-CM | POA: Insufficient documentation
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
## 2021-04-08 ENCOUNTER — Ambulatory Visit: Payer: Medicaid Other | Attending: Internal Medicine

## 2021-04-08 DIAGNOSIS — G8929 Other chronic pain: Secondary | ICD-10-CM | POA: Insufficient documentation

## 2021-04-08 DIAGNOSIS — M6281 Muscle weakness (generalized): Secondary | ICD-10-CM | POA: Insufficient documentation

## 2021-04-08 DIAGNOSIS — M25512 Pain in left shoulder: Secondary | ICD-10-CM | POA: Insufficient documentation

## 2021-04-08 NOTE — Therapy (Incomplete)
OUTPATIENT PHYSICAL THERAPY TREATMENT NOTE   Patient Name: [name] MRN: [id number] DOB:11/24/1985, 36 y.o., female Today's Date: 04/08/2021  PCP: Fleet Contras, MD REFERRING PROVIDER: Fleet Contras, MD    No past medical history on file. Past Surgical History:  Procedure Laterality Date   MANDIBLE FRACTURE SURGERY     Patient Active Problem List   Diagnosis Date Noted   Normal delivery 10/02/2013   Indication for care in labor or delivery 10/01/2013    REFERRING DIAG: ***  THERAPY DIAG:  No diagnosis found.  PERTINENT HISTORY: ***  PRECAUTIONS: ***  SUBJECTIVE: ***  PAIN:  Are you having pain? {yes/no:20286} NPRS scale: ***/10 Pain location: *** Pain orientation: {Pain Orientation:25161}  PAIN TYPE: {type:313116} Pain description: {PAIN DESCRIPTION:21022940}  Aggravating factors: *** Relieving factors: ***    OBJECTIVE:    DIAGNOSTIC FINDINGS:    03/21/18 Xray: FINDINGS: There is no evidence of fracture or dislocation. There is no evidence of arthropathy or other focal bone abnormality. Subacromial morphology is type 2 (curved).   IMPRESSION: Negative.     COGNITION:          Overall cognitive status: Within functional limits for tasks assessed                               SENSATION:          Light touch: Appears intact   POSTURE: Forward head   PALPATION: Pt reports significant pain with light palpation of the lateral and medial borders of the L scapula   UPPER EXTREMITY AROM/PROM:   A/PROM Right 03/06/2021 Left 03/06/2021  Shoulder flexion 160 135  Shoulder extension      Shoulder abduction      Shoulder adduction      Shoulder internal rotation T7 L4  Shoulder external rotation T4 C7  (Blank rows = not tested)   UPPER EXTREMITY MMT:   MMT Right 03/06/2021 Left 03/06/2021  Shoulder flexion   3  Shoulder extension   3  Shoulder abduction   3  Shoulder adduction   3  ER   3  IR   3  - Pt reported significant L shoulder  pain with give way strength with light MMT resistance   SHOULDER SPECIAL TESTS:           Impingement tests: Hawkins/Kennedy impingement test: Unable to assess with pt reporting all L shoulder movements as being painful Rotator cuff assessment: Empty can test: and Full can test: Unable to assess with pt reporting all L shoulder movements as being painful and all L shoulder movements testing weak            TODAY'S TREATMENT:  - Recommended continuing therex for periscapular strengthening, see HEP. Pt reports being familiar with these exs.     PATIENT EDUCATION: Education details: Eval findings, POC, HEP Person educated: Patient Education method: Medical illustrator Education comprehension: verbalized understanding     HOME EXERCISE PROGRAM: Access Code: [email or web address] URL: [email or web address] Date: 03/06/2021 Prepared by: Joellyn Rued   Exercises Seated Scapular Retraction - 1 x daily - 7 x weekly - 2 sets - 10 reps - 5 sec hold Standing Shoulder Row with Anchored Resistance - 1 x daily - 7 x weekly - 3 sets - 15 reps - 3 sec hold Shoulder External Rotation and Scapular Retraction with Resistance - 1 x daily - 7 x weekly - 3 sets - 10 reps Standing Shoulder Horizontal Abduction  with Resistance - 1 x daily - 7 x weekly - 3 sets - 10 reps     ASSESSMENT:   CLINICAL IMPRESSION: ***   REHAB POTENTIAL: Poor due to chronicity  of L shoulder pain. Pt has received PT for her L shoulder at this clinic for 2.5 years.   CLINICAL DECISION MAKING: Stable/uncomplicated   EVALUATION COMPLEXITY: Low     GOALS:     SHORT TERM GOALS= LTGs     LONG TERM GOALS:    LTG Name Target Date Goal status  1 Pt will be Ind in a final HEP to maintain achieved LOF Baseline: 04/24/21 INITIAL  2 Pt will report a decrease in L shoulder pain with daily activities to 4/10 or less Baseline:7-10/10 04/24/21 INITIAL  3 Pt will demonstrated L shoulder strength of 4/5 for improved  functional use of her L UE Baseline: grossly 3/5 04/24/21 INITIAL  4 Pt will demonstrate improved L shoulder ROM to flex-145d, ER- T2, and IR - T12 for improved functional use of her L UE Baseline: 04/24/21 INITIAL  5 Set advanced goals for the L shoulder as indicated Baseline: 04/24/21 INITIAL    PLAN: PT FREQUENCY: 2x/week   PT DURATION: 6 weeks   PLANNED INTERVENTIONS: Therapeutic exercises, Therapeutic activity, Patient/Family education, Joint mobilization, Dry Needling, Electrical stimulation, Spinal mobilization, Cryotherapy, Moist heat, Taping, Traction, Ultrasound, Ionotophoresis 4mg /ml Dexamethasone, and Manual therapy   PLAN FOR NEXT SESSION: Assess response to HEP. Progress therex as as indicated. Use of manual care and modalities as indicated.    , PT 04/08/2021, 4:04 PM

## 2021-04-21 ENCOUNTER — Ambulatory Visit: Payer: Medicaid Other

## 2021-04-21 ENCOUNTER — Other Ambulatory Visit: Payer: Self-pay

## 2021-04-21 DIAGNOSIS — M25512 Pain in left shoulder: Secondary | ICD-10-CM

## 2021-04-21 DIAGNOSIS — G8929 Other chronic pain: Secondary | ICD-10-CM

## 2021-04-21 DIAGNOSIS — M6281 Muscle weakness (generalized): Secondary | ICD-10-CM

## 2021-04-21 NOTE — Therapy (Signed)
OUTPATIENT PHYSICAL THERAPY TREATMENT NOTE/Re-Evaluation   Patient Name: [name] MRN: [id number] DOB:Apr 06, 1985, 36 y.o., female Today's Date: 04/21/2021  PCP: Fleet Contras, MD REFERRING PROVIDER: Fleet Contras, MD   PT End of Session - 04/21/21 1618     Visit Number 2    Number of Visits 13    Date for PT Re-Evaluation 06/02/21    Authorization Type MCD - [employer]    Progress Note Due on Visit 10    PT Start Time 1620    PT Stop Time 1700    PT Time Calculation (min) 40 min    Activity Tolerance Patient limited by pain    Behavior During Therapy [facility] for tasks assessed/performed             History reviewed. No pertinent past medical history. Past Surgical History:  Procedure Laterality Date   MANDIBLE FRACTURE SURGERY     Patient Active Problem List   Diagnosis Date Noted   Normal delivery 10/02/2013   Indication for care in labor or delivery 10/01/2013    REFERRING DIAG: chronic pain in left shoulder  THERAPY DIAG:  Chronic left shoulder pain - Plan: PT plan of care cert/re-cert  Muscle weakness (generalized) - Plan: PT plan of care cert/re-cert  PERTINENT HISTORY: None  PRECAUTIONS: None  SUBJECTIVE:  Pt presents to PT with continued reports of chronic L shoulder pain. Pt is slightly agitated while PT tried to explain that her POC is ending soon and she had not been seen for follow up visits and we would have to do a re-certification. She states she has been fairly compliant with her HEP, but she continues to have sharp and burning pain in L shoulder, especially with activity.   Pain: Are you having pain? Yes NPRS: 7/10 Pain Location: L shoulder Pain Frequency/Description: intermittent  Aggravating Factors: working, sleeping, driving Relieving Factors: heating pad    OBJECTIVE:    UPPER EXTREMITY AROM/PROM:   A/PROM Left 03/06/2021 Left  04/21/2021  Shoulder flexion 135 140  Shoulder extension   120  Shoulder abduction     Shoulder  adduction     Shoulder internal rotation L4 L4  Shoulder external rotation C7   (Blank rows = not tested)   UPPER EXTREMITY MMT:   MMT Left 03/06/2021 Left 04/21/2021  Shoulder flexion 3 3/5  Shoulder extension 3   Shoulder abduction 3 3/5  Shoulder adduction 3   ER 3 3/5 (gave away)  IR 3 3/5  - Pt reported significant L shoulder pain with give way strength with light MMT resistance   INTERVENTIONS:  [facility] Adult PT Treatment:                                                DATE: 04/21/2021 Therapeutic Exercise: UBE lvl 2.0 x 4 min (54fwd/2bwd) Row 3x10 17#  L shoulder ER RTB 2x10  Bilateral ER w/ scap retraction x 10 RTB Manual Therapy: Trigger point release to L upper trap Positional release L upper trap Trigger point release L levator Therapeutic Activity: Assessments of tests/measures and goals for progress/re-cert   PATIENT EDUCATION: Education details: re-eval findings, HEP, POC  Person educated: Patient Education method: Explanation, Demonstration, and Handout Education comprehension: verbalized understanding     HOME EXERCISE PROGRAM: Access Code: [email or web address] URL: [email or web address] Date: 04/21/2021 Prepared by: Edwinna Areola  Exercises Standing Shoulder Row  with Anchored Resistance - 1 x daily - 7 x weekly - 3 sets - 15 reps - 3 sec hold Shoulder External Rotation and Scapular Retraction with Resistance - 1 x daily - 7 x weekly - 3 sets - 10 reps Shoulder External Rotation with Anchored Resistance - 1 x daily - 7 x weekly - 3 sets - 10 reps    ASSESSMENT: Pt presented to PT with continued reports of severe L shoulder pain. Objective examination revealed slightly decreased L shoulder AROM compared to unimpared R UE. She continues to have decreased, give-away, strength testing to L proximal shoulder and RTC musculature. She would benefit from extension of skilled physical therapy services, working on improving RTC and periscapular strength in order to  decrease pain and improve function.   Problem List: decreased ROM, decreased strength, and pain     GOALS:   LONG TERM GOALS:    LTG Name Target Date Goal status  1 Pt will be Ind in a final HEP to maintain achieved LOF Baseline: 06/02/2021 ONGOING  2 Pt will report a decrease in L shoulder pain with daily activities to 4/10 or less Baseline:7-10/10 06/02/2021 ONGOING  3 Pt will demonstrated L shoulder strength of 4/5 for improved functional use of her L UE Baseline: grossly 3/5 04/21/2021: see chart 06/02/2021 ONGOING  4 Pt will demonstrate improved L shoulder ROM to flex-145d, ER- T2, and IR - T12 for improved functional use of her L UE Baseline: see cjart 06/02/2021 ONGOING  5 Set advanced goals for the L shoulder as indicated Baseline: 06/02/2021 ONGOING    PLAN: PT FREQUENCY: 1x/week   PT DURATION: 6 weeks   PLANNED INTERVENTIONS: Therapeutic exercises, Therapeutic activity, Patient/Family education, Joint mobilization, Dry Needling, Electrical stimulation, Spinal mobilization, Cryotherapy, Moist heat, Taping, Traction, Ultrasound, Ionotophoresis 4mg /ml Dexamethasone, and Manual therapy   PLAN FOR NEXT SESSION: Assess response to HEP. Progress therex as as indicated. Use of manual care and modalities as indicated.  [employer] Authorization   Choose one: Rehabilitative  Standardized Assessment or Functional Outcome Tool: See Pain Assessment and N/A  Score or Percent Disability: N/A  Body Parts Treated (Select each separately):  Shoulder. Overall deficits/functional limitations for body part selected: moderate N/A. Overall deficits/functional limitations for body part selected:  N/A. Overall deficits/functional limitations for body part selected:    , PT 04/21/2021, 6:12 PM

## 2021-04-28 ENCOUNTER — Ambulatory Visit: Payer: Medicaid Other

## 2021-04-28 ENCOUNTER — Other Ambulatory Visit: Payer: Self-pay

## 2021-04-28 DIAGNOSIS — M6281 Muscle weakness (generalized): Secondary | ICD-10-CM

## 2021-04-28 DIAGNOSIS — M25512 Pain in left shoulder: Secondary | ICD-10-CM | POA: Diagnosis not present

## 2021-04-28 DIAGNOSIS — G8929 Other chronic pain: Secondary | ICD-10-CM

## 2021-04-28 NOTE — Therapy (Signed)
OUTPATIENT PHYSICAL THERAPY TREATMENT NOTE/Re-Evaluation   Patient Name: [name] MRN: [id number] DOB:05/19/1985, 36 y.o., female Today's Date: 04/28/2021  PCP: Nolene Ebbs, MD REFERRING PROVIDER: Nolene Ebbs, MD   PT End of Session - 04/28/21 1843     Visit Number 3    Number of Visits 13    Date for PT Re-Evaluation 06/02/21    Authorization Type MCD - [employer]    Progress Note Due on Visit 10    PT Start Time 1842   deducted 5 min for TPDN   PT Stop Time 1915    PT Time Calculation (min) 33 min    Activity Tolerance Patient limited by pain    Behavior During Therapy [facility] for tasks assessed/performed             History reviewed. No pertinent past medical history. Past Surgical History:  Procedure Laterality Date   MANDIBLE FRACTURE SURGERY     Patient Active Problem List   Diagnosis Date Noted   Normal delivery 10/02/2013   Indication for care in labor or delivery 10/01/2013    REFERRING DIAG: chronic pain in left shoulder  THERAPY DIAG:  Chronic left shoulder pain  Muscle weakness (generalized)  PERTINENT HISTORY: None  PRECAUTIONS: None  SUBJECTIVE:  Pt presents to PT with continued L shoulder pain. Has been fairly compliant with HEP with no adverse effect. Pt is ready to begin PT at this time.   Pain: Are you having pain? Yes NPRS: 7/10 Pain Location: L shoulder Pain Frequency/Description: intermittent  Aggravating Factors: working, sleeping, driving Relieving Factors: heating pad    OBJECTIVE:    UPPER EXTREMITY AROM/PROM:   A/PROM Left 03/06/2021 Left  04/21/2021  Shoulder flexion 135 140  Shoulder extension   120  Shoulder abduction     Shoulder adduction     Shoulder internal rotation L4 L4  Shoulder external rotation C7   (Blank rows = not tested)   UPPER EXTREMITY MMT:   MMT Left 03/06/2021 Left 04/21/2021  Shoulder flexion 3 3/5  Shoulder extension 3   Shoulder abduction 3 3/5  Shoulder adduction 3   ER 3 3/5  (gave away)  IR 3 3/5  - Pt reported significant L shoulder pain with give way strength with light MMT resistance   INTERVENTIONS:  [facility] Adult PT Treatment:                                                DATE: 04/28/2021 Therapeutic Exercise: UBE lvl 2.0 x 3 min (1.47fwd/1.5bwd) Seated row 2x10 25# Serratus roll with soft foam 2x10 YTB Bilateral ER w/ scap retraction x 15 RTB Seated horizontal abd x 15 RTB Manual Therapy: Skilled palpation of trigger point STM of L periscapular muscles Trigger Point Dry Needling Treatment: Pre-treatment instruction: Patient instructed on dry needling rationale, procedures, and possible side effects including pain during treatment (achy,cramping feeling), bruising, drop of blood, lightheadedness, nausea, sweating. Patient Consent Given: Yes Education handout provided: No Muscles treated: L rhomboids  Needle size and number:  30x72mm Electrical stimulation performed: No Parameters: N/A Treatment response/outcome: Twitch response elicited and Palpable decrease in muscle tension Post-treatment instructions: Patient instructed to expect possible mild to moderate muscle soreness later today and/or tomorrow. Patient instructed in methods to reduce muscle soreness and to continue prescribed HEP. If patient was dry needled over the lung field, patient  was instructed on signs and symptoms of pneumothorax and, however unlikely, to see immediate medical attention should they occur. Patient was also educated on signs and symptoms of infection and to seek medical attention should they occur. Patient verbalized understanding of these instructions and education.   [facility] Adult PT Treatment:                                                DATE: 04/21/2021 Therapeutic Exercise: UBE lvl 2.0 x 4 min (50fwd/2bwd) Row 3x10 17#  L shoulder ER RTB 2x10  Bilateral ER w/ scap retraction x 10 RTB Manual Therapy: Trigger point release to L upper trap Positional release L upper  trap Trigger point release L levator Therapeutic Activity: Assessments of tests/measures and goals for progress/re-cert   PATIENT EDUCATION: Education details: re-eval findings, HEP, POC  Person educated: Patient Education method: Explanation, Demonstration, and Handout Education comprehension: verbalized understanding     HOME EXERCISE PROGRAM: Access Code: [email or web address] URL: [email or web address] Date: 04/21/2021 Prepared by: Octavio Manns  Exercises Standing Shoulder Row with Anchored Resistance - 1 x daily - 7 x weekly - 3 sets - 15 reps - 3 sec hold Shoulder External Rotation and Scapular Retraction with Resistance - 1 x daily - 7 x weekly - 3 sets - 10 reps Shoulder External Rotation with Anchored Resistance - 1 x daily - 7 x weekly - 3 sets - 10 reps    ASSESSMENT: Pt responded well to interventions today, noting decreased pain post session. We continued to focus on periscapular strengthening in order to decrease L shoulder pain. She responded well to TPDN, multiple twitches observed. Pt will continue to be seen and progressed as tolerated.   Problem List: decreased ROM, decreased strength, and pain     GOALS:   LONG TERM GOALS:    LTG Name Target Date Goal status  1 Pt will be Ind in a final HEP to maintain achieved LOF Baseline: 06/02/2021 ONGOING  2 Pt will report a decrease in L shoulder pain with daily activities to 4/10 or less Baseline:7-10/10 06/02/2021 ONGOING  3 Pt will demonstrated L shoulder strength of 4/5 for improved functional use of her L UE Baseline: grossly 3/5 04/21/2021: see chart 06/02/2021 ONGOING  4 Pt will demonstrate improved L shoulder ROM to flex-145d, ER- T2, and IR - T12 for improved functional use of her L UE Baseline: see cjart 06/02/2021 ONGOING  5 Set advanced goals for the L shoulder as indicated Baseline: 06/02/2021 ONGOING    PLAN: PT FREQUENCY: 1x/week   PT DURATION: 6 weeks   PLANNED INTERVENTIONS: Therapeutic exercises,  Therapeutic activity, Patient/Family education, Joint mobilization, Dry Needling, Electrical stimulation, Spinal mobilization, Cryotherapy, Moist heat, Taping, Traction, Ultrasound, Ionotophoresis 4mg /ml Dexamethasone, and Manual therapy   PLAN FOR NEXT SESSION: Assess response to HEP. Progress therex as as indicated. Use of manual care and modalities as indicated.   Ward Chatters, PT 04/28/2021, 7:23 PM

## 2021-05-06 ENCOUNTER — Telehealth: Payer: Self-pay

## 2021-05-06 ENCOUNTER — Ambulatory Visit: Payer: Medicaid Other | Attending: Internal Medicine

## 2021-05-06 DIAGNOSIS — M25612 Stiffness of left shoulder, not elsewhere classified: Secondary | ICD-10-CM | POA: Insufficient documentation

## 2021-05-06 DIAGNOSIS — G8929 Other chronic pain: Secondary | ICD-10-CM | POA: Insufficient documentation

## 2021-05-06 DIAGNOSIS — M6281 Muscle weakness (generalized): Secondary | ICD-10-CM | POA: Insufficient documentation

## 2021-05-06 DIAGNOSIS — M25512 Pain in left shoulder: Secondary | ICD-10-CM | POA: Insufficient documentation

## 2021-05-06 NOTE — Therapy (Incomplete)
?OUTPATIENT PHYSICAL THERAPY TREATMENT NOTE/Re-Evaluation ? ? ?Patient Name: [name] ?MRN: [id number] ?DOB:1985/05/12, 36 y.o., female ?Today's Date: 05/06/2021 ? ?PCP: Nolene Ebbs, MD ?REFERRING PROVIDER: Nolene Ebbs, MD ? ? ? ? ?No past medical history on file. ?Past Surgical History:  ?Procedure Laterality Date  ? MANDIBLE FRACTURE SURGERY    ? ?Patient Active Problem List  ? Diagnosis Date Noted  ? Normal delivery 10/02/2013  ? Indication for care in labor or delivery 10/01/2013  ? ? ?REFERRING DIAG: chronic pain in left shoulder ? ?THERAPY DIAG:  ?No diagnosis found. ? ?PERTINENT HISTORY: None ? ?PRECAUTIONS: None ? ?SUBJECTIVE:  ?*** ? ?Pain: ?Are you having pain? Yes ?NPRS: 7/10 ?Pain Location: L shoulder ?Pain Frequency/Description: intermittent  ?Aggravating Factors: working, sleeping, driving ?Relieving Factors: heating pad  ? ? ?OBJECTIVE:  ?  ?UPPER EXTREMITY AROM/PROM: ?  ?A/PROM Left ?03/06/2021 Left  ?04/21/2021  ?Shoulder flexion 135 140  ?Shoulder extension   120  ?Shoulder abduction     ?Shoulder adduction     ?Shoulder internal rotation L4 L4  ?Shoulder external rotation C7   ?(Blank rows = not tested) ?  ?UPPER EXTREMITY MMT: ?  ?MMT Left ?03/06/2021 Left ?04/21/2021  ?Shoulder flexion 3 3/5  ?Shoulder extension 3   ?Shoulder abduction 3 3/5  ?Shoulder adduction 3   ?ER 3 3/5 (gave away)  ?IR 3 3/5  ?- Pt reported significant L shoulder pain with give way strength with light MMT resistance ?  ?INTERVENTIONS: ? ?[facility] Adult PT Treatment:                                                DATE: 04/28/2021 ?Therapeutic Exercise: ?UBE lvl 2.0 x 3 min (1.74fwd/1.5bwd) ?Seated row 2x10 25# ?Serratus roll with soft foam 2x10 YTB ?Bilateral ER w/ scap retraction x 15 RTB ?Seated horizontal abd x 15 RTB ?Manual Therapy: ?Skilled palpation of trigger point ?STM of L periscapular muscles ?Trigger Point Dry Needling Treatment: ?Pre-treatment instruction: Patient instructed on dry needling rationale,  procedures, and possible side effects including pain during treatment (achy,cramping feeling), bruising, drop of blood, lightheadedness, nausea, sweating. ?Patient Consent Given: Yes ?Education handout provided: No ?Muscles treated: L rhomboids  ?Needle size and number:  30x70mm ?Electrical stimulation performed: No ?Parameters: N/A ?Treatment response/outcome: Twitch response elicited and Palpable decrease in muscle tension ?Post-treatment instructions: Patient instructed to expect possible mild to moderate muscle soreness later today and/or tomorrow. Patient instructed in methods to reduce muscle soreness and to continue prescribed HEP. If patient was dry needled over the lung field, patient was instructed on signs and symptoms of pneumothorax and, however unlikely, to see immediate medical attention should they occur. Patient was also educated on signs and symptoms of infection and to seek medical attention should they occur. Patient verbalized understanding of these instructions and education.  ? ?[facility] Adult PT Treatment:                                                DATE: 04/21/2021 ?Therapeutic Exercise: ?UBE lvl 2.0 x 4 min (73fwd/2bwd) ?Row 3x10 17#  ?L shoulder ER RTB 2x10  ?Bilateral ER w/ scap retraction x 10 RTB ?Manual Therapy: ?Trigger point release to L upper trap ?Positional release  L upper trap ?Trigger point release L levator ?Therapeutic Activity: ?Assessments of tests/measures and goals for progress/re-cert ?  ?PATIENT EDUCATION: ?Education details: re-eval findings, HEP, POC  ?Person educated: Patient ?Education method: Explanation, Demonstration, and Handout ?Education comprehension: verbalized understanding ?  ?  ?HOME EXERCISE PROGRAM: ?Access Code: [email or web address] ?URL: [email or web address] ?Date: 04/21/2021 ?Prepared by: Octavio Manns ? ?Exercises ?Standing Shoulder Row with Anchored Resistance - 1 x daily - 7 x weekly - 3 sets - 15 reps - 3 sec hold ?Shoulder External Rotation and  Scapular Retraction with Resistance - 1 x daily - 7 x weekly - 3 sets - 10 reps ?Shoulder External Rotation with Anchored Resistance - 1 x daily - 7 x weekly - 3 sets - 10 reps ? ?  ?ASSESSMENT: ?*** ? ?Problem List: ?decreased ROM, decreased strength, and pain   ?  ?GOALS: ?  ?LONG TERM GOALS:  ?  ?LTG Name Target Date Goal status  ?1 Pt will be Ind in a final HEP to maintain achieved LOF ?Baseline: 06/02/2021 ONGOING  ?2 Pt will report a decrease in L shoulder pain with daily activities to 4/10 or less ?Baseline:7-10/10 06/02/2021 ONGOING  ?3 Pt will demonstrated L shoulder strength of 4/5 for improved functional use of her L UE ?Baseline: grossly 3/5 ?04/21/2021: see chart 06/02/2021 ONGOING  ?4 Pt will demonstrate improved L shoulder ROM to flex-145d, ER- T2, and IR - T12 for improved functional use of her L UE ?Baseline: see cjart 06/02/2021 ONGOING  ?5 Set advanced goals for the L shoulder as indicated ?Baseline: 06/02/2021 ONGOING  ?  ?PLAN: ?PT FREQUENCY: 1x/week ?  ?PT DURATION: 6 weeks ?  ?PLANNED INTERVENTIONS: Therapeutic exercises, Therapeutic activity, Patient/Family education, Joint mobilization, Dry Needling, Electrical stimulation, Spinal mobilization, Cryotherapy, Moist heat, Taping, Traction, Ultrasound, Ionotophoresis 4mg /ml Dexamethasone, and Manual therapy ?  ?PLAN FOR NEXT SESSION: Assess response to HEP. Progress therex as as indicated. Use of manual care and modalities as indicated. ? ? ?Ward Chatters, PT ?05/06/2021, 9:53 AM ? ?  ? ?

## 2021-05-06 NOTE — Telephone Encounter (Signed)
PT called and spoke with patient regarding missed visit. Patient stated she was sick and called to reschedule her daughters appointment, but forgot about her appointment this evening.  ? ?She will call back to reschedule, can still only schedule one at a time.  ? ?Eloy End, PT ?05/06/21 7:14 PM ? ?

## 2021-05-20 ENCOUNTER — Other Ambulatory Visit: Payer: Self-pay

## 2021-05-20 ENCOUNTER — Ambulatory Visit: Payer: Medicaid Other

## 2021-05-20 DIAGNOSIS — G8929 Other chronic pain: Secondary | ICD-10-CM

## 2021-05-20 DIAGNOSIS — M25512 Pain in left shoulder: Secondary | ICD-10-CM | POA: Diagnosis not present

## 2021-05-20 DIAGNOSIS — M6281 Muscle weakness (generalized): Secondary | ICD-10-CM | POA: Diagnosis present

## 2021-05-20 DIAGNOSIS — M25612 Stiffness of left shoulder, not elsewhere classified: Secondary | ICD-10-CM | POA: Diagnosis present

## 2021-05-20 NOTE — Therapy (Signed)
?OUTPATIENT PHYSICAL THERAPY TREATMENT NOTE ? ? ?Patient Name: [name] ?MRN: [id number] ?DOB:1985-07-06, 36 y.o., female ?Today's Date: 05/21/2021 ? ?PCP: Fleet Contras, MD ?REFERRING PROVIDER: Fleet Contras, MD ? ? PT End of Session - 05/20/21 1847   ? ? Visit Number 4   ? Number of Visits 13   ? Date for PT Re-Evaluation 06/02/21   ? Authorization Type [facility] - [facility]   ? Progress Note Due on Visit 10   ? PT Start Time 1845   arrived late  ? PT Stop Time 1915   ? PT Time Calculation (min) 30 min   ? Activity Tolerance Patient limited by pain   ? Behavior During Therapy [facility] for tasks assessed/performed   ? ?  ?  ? ?  ? ? ? ?History reviewed. No pertinent past medical history. ?Past Surgical History:  ?Procedure Laterality Date  ? MANDIBLE FRACTURE SURGERY    ? ?Patient Active Problem List  ? Diagnosis Date Noted  ? Normal delivery 10/02/2013  ? Indication for care in labor or delivery 10/01/2013  ? ? ?REFERRING DIAG: chronic pain in left shoulder ? ?THERAPY DIAG:  ?Chronic left shoulder pain ? ?Muscle weakness (generalized) ? ?PERTINENT HISTORY: None ? ?PRECAUTIONS: None ? ?SUBJECTIVE:  ?Pt presents to PT with continued reports of L shoulder pain. Has not been as compliant as she would like with HEP. Pt is ready to begin PT at this time.  ? ?Pain: ?Are you having pain? Yes ?NPRS: 7/10 ?Pain Location: L shoulder ?Pain Frequency/Description: intermittent  ?Aggravating Factors: working, sleeping, driving ?Relieving Factors: heating pad  ? ? ?OBJECTIVE:  ?  ?UPPER EXTREMITY AROM/PROM: ?  ?A/PROM Left ?03/06/2021 Left  ?04/21/2021  ?Shoulder flexion 135 140  ?Shoulder extension   120  ?Shoulder abduction     ?Shoulder adduction     ?Shoulder internal rotation L4 L4  ?Shoulder external rotation C7   ?(Blank rows = not tested) ?  ?UPPER EXTREMITY MMT: ?  ?MMT Left ?03/06/2021 Left ?04/21/2021  ?Shoulder flexion 3 3/5  ?Shoulder extension 3   ?Shoulder abduction 3 3/5  ?Shoulder adduction 3   ?ER 3 3/5 (gave away)  ?IR 3  3/5  ?- Pt reported significant L shoulder pain with give way strength with light MMT resistance ?  ?INTERVENTIONS: ?[facility] Adult PT Treatment:                                                DATE: 05/20/2021 ?Therapeutic Exercise: ?UBE lvl 2.0 x 3 min (1.29fwd/1.5bwd) ?Standing row 2x10 17# ?Horizontal abd cross 3# x 10 ?Serratus punch x 15 3# ?Serratus roll with soft foam 2x10 YTB ?YTB wall walk horizontal x 10 - 62ft ?Seated row 2x10 25# ?Lat pulldown 2x10 25# ? ?[facility] Adult PT Treatment:                                                DATE: 04/28/2021 ?Therapeutic Exercise: ?UBE lvl 2.0 x 3 min (1.41fwd/1.5bwd) ?Seated row 2x10 25# ?Serratus roll with soft foam 2x10 YTB ?Bilateral ER w/ scap retraction x 15 RTB ?Seated horizontal abd x 15 RTB ?Manual Therapy: ?Skilled palpation of trigger point ?STM of L periscapular muscles ?Trigger Point Dry Needling Treatment: ?  Pre-treatment instruction: Patient instructed on dry needling rationale, procedures, and possible side effects including pain during treatment (achy,cramping feeling), bruising, drop of blood, lightheadedness, nausea, sweating. ?Patient Consent Given: Yes ?Education handout provided: No ?Muscles treated: L rhomboids  ?Needle size and number:  30x69mm ?Electrical stimulation performed: No ?Parameters: N/A ?Treatment response/outcome: Twitch response elicited and Palpable decrease in muscle tension ?Post-treatment instructions: Patient instructed to expect possible mild to moderate muscle soreness later today and/or tomorrow. Patient instructed in methods to reduce muscle soreness and to continue prescribed HEP. If patient was dry needled over the lung field, patient was instructed on signs and symptoms of pneumothorax and, however unlikely, to see immediate medical attention should they occur. Patient was also educated on signs and symptoms of infection and to seek medical attention should they occur. Patient verbalized understanding of these instructions and  education.  ? ?[facility] Adult PT Treatment:                                                DATE: 04/21/2021 ?Therapeutic Exercise: ?UBE lvl 2.0 x 4 min (24fwd/2bwd) ?Row 3x10 17#  ?L shoulder ER RTB 2x10  ?Bilateral ER w/ scap retraction x 10 RTB ?Manual Therapy: ?Trigger point release to L upper trap ?Positional release L upper trap ?Trigger point release L levator ?Therapeutic Activity: ?Assessments of tests/measures and goals for progress/re-cert ?  ?PATIENT EDUCATION: ?Education details: re-eval findings, HEP, POC  ?Person educated: Patient ?Education method: Explanation, Demonstration, and Handout ?Education comprehension: verbalized understanding ?  ?  ?HOME EXERCISE PROGRAM: ?Access Code: [email or web address] ?URL: [email or web address] ?Date: 04/21/2021 ?Prepared by: Edwinna Areola ? ?Exercises ?Standing Shoulder Row with Anchored Resistance - 1 x daily - 7 x weekly - 3 sets - 15 reps - 3 sec hold ?Shoulder External Rotation and Scapular Retraction with Resistance - 1 x daily - 7 x weekly - 3 sets - 10 reps ?Shoulder External Rotation with Anchored Resistance - 1 x daily - 7 x weekly - 3 sets - 10 reps ? ?  ?ASSESSMENT: ?Pt was able to complete all prescribed exercises with no adverse effect. Therapy today continued to focus on increasing periscapular and RTC strength to reduce shoulder pain. Pt continues to benefit from skilled PT services, with PT explaining that she needs to be more compliant with HEP. Pt will continue to be seen per POC as prescribed.  ? ?Problem List: ?decreased ROM, decreased strength, and pain   ?  ?GOALS: ?  ?LONG TERM GOALS:  ?  ?LTG Name Target Date Goal status  ?1 Pt will be Ind in a final HEP to maintain achieved LOF ?Baseline: 06/02/2021 ONGOING  ?2 Pt will report a decrease in L shoulder pain with daily activities to 4/10 or less ?Baseline:7-10/10 06/02/2021 ONGOING  ?3 Pt will demonstrated L shoulder strength of 4/5 for improved functional use of her L UE ?Baseline: grossly  3/5 ?04/21/2021: see chart 06/02/2021 ONGOING  ?4 Pt will demonstrate improved L shoulder ROM to flex-145d, ER- T2, and IR - T12 for improved functional use of her L UE ?Baseline: see cjart 06/02/2021 ONGOING  ?5 Set advanced goals for the L shoulder as indicated ?Baseline: 06/02/2021 ONGOING  ?  ?PLAN: ?PT FREQUENCY: 1x/week ?  ?PT DURATION: 6 weeks ?  ?PLANNED INTERVENTIONS: Therapeutic exercises, Therapeutic activity, Patient/Family education, Joint mobilization, Dry Needling, Electrical stimulation, Spinal mobilization, Cryotherapy, Moist heat,  Taping, Traction, Ultrasound, Ionotophoresis 4mg /ml Dexamethasone, and Manual therapy ?  ?PLAN FOR NEXT SESSION: Assess response to HEP. Progress therex as as indicated. Use of manual care and modalities as indicated. ? ? ?Eloy Endavid C Eulala Newcombe, PT ?05/21/2021, 9:11 AM ? ?  ? ?

## 2021-05-28 ENCOUNTER — Ambulatory Visit: Payer: Medicaid Other

## 2021-05-28 NOTE — Therapy (Incomplete)
?OUTPATIENT PHYSICAL THERAPY TREATMENT NOTE ? ? ?Patient Name: [name] ?MRN: [id number] ?DOB:1985/08/14, 36 y.o., female ?Today's Date: 05/28/2021 ? ?PCP: Fleet Contras, MD ?REFERRING PROVIDER: Fleet Contras, MD ? ? ? ? ? ?No past medical history on file. ?Past Surgical History:  ?Procedure Laterality Date  ? MANDIBLE FRACTURE SURGERY    ? ?Patient Active Problem List  ? Diagnosis Date Noted  ? Normal delivery 10/02/2013  ? Indication for care in labor or delivery 10/01/2013  ? ? ?REFERRING DIAG: chronic pain in left shoulder ? ?THERAPY DIAG:  ?No diagnosis found. ? ?PERTINENT HISTORY: None ? ?PRECAUTIONS: None ? ?SUBJECTIVE:  ?Pt presents to PT with continued reports of L shoulder pain. Has not been as compliant as she would like with HEP. Pt is ready to begin PT at this time.  ? ?Pain: ?Are you having pain? Yes ?NPRS: 7/10 ?Pain Location: L shoulder ?Pain Frequency/Description: intermittent  ?Aggravating Factors: working, sleeping, driving ?Relieving Factors: heating pad  ? ? ?OBJECTIVE:  ?  ?UPPER EXTREMITY AROM/PROM: ?  ?A/PROM Left ?03/06/2021 Left  ?04/21/2021  ?Shoulder flexion 135 140  ?Shoulder extension   120  ?Shoulder abduction     ?Shoulder adduction     ?Shoulder internal rotation L4 L4  ?Shoulder external rotation C7   ?(Blank rows = not tested) ?  ?UPPER EXTREMITY MMT: ?  ?MMT Left ?03/06/2021 Left ?04/21/2021  ?Shoulder flexion 3 3/5  ?Shoulder extension 3   ?Shoulder abduction 3 3/5  ?Shoulder adduction 3   ?ER 3 3/5 (gave away)  ?IR 3 3/5  ?- Pt reported significant L shoulder pain with give way strength with light MMT resistance ?  ?INTERVENTIONS: ?[facility] Adult PT Treatment:                                                DATE: 05/28/2021 ?Therapeutic Exercise: ?UBE lvl 2.0 x 3 min (1.73fwd/1.5bwd) ?Standing row 2x10 17# ?Horizontal abd cross 3# x 10 ?Serratus punch x 15 3# ?Serratus roll with soft foam 2x10 YTB ?YTB wall walk horizontal x 10 - 14ft ?Seated row 2x10 25# ?Lat pulldown 2x10 25# ? ?[facility]  Adult PT Treatment:                                                DATE: 05/20/2021 ?Therapeutic Exercise: ?UBE lvl 2.0 x 3 min (1.17fwd/1.5bwd) ?Standing row 2x10 17# ?Horizontal abd cross 3# x 10 ?Serratus punch x 15 3# ?Serratus roll with soft foam 2x10 YTB ?YTB wall walk horizontal x 10 - 9ft ?Seated row 2x10 25# ?Lat pulldown 2x10 25# ? ?[facility] Adult PT Treatment:                                                DATE: 04/28/2021 ?Therapeutic Exercise: ?UBE lvl 2.0 x 3 min (1.32fwd/1.5bwd) ?Seated row 2x10 25# ?Serratus roll with soft foam 2x10 YTB ?Bilateral ER w/ scap retraction x 15 RTB ?Seated horizontal abd x 15 RTB ?Manual Therapy: ?Skilled palpation of trigger point ?STM of L periscapular muscles ?Trigger Point Dry Needling Treatment: ?Pre-treatment instruction: Patient instructed on dry  needling rationale, procedures, and possible side effects including pain during treatment (achy,cramping feeling), bruising, drop of blood, lightheadedness, nausea, sweating. ?Patient Consent Given: Yes ?Education handout provided: No ?Muscles treated: L rhomboids  ?Needle size and number:  30x11mm ?Electrical stimulation performed: No ?Parameters: N/A ?Treatment response/outcome: Twitch response elicited and Palpable decrease in muscle tension ?Post-treatment instructions: Patient instructed to expect possible mild to moderate muscle soreness later today and/or tomorrow. Patient instructed in methods to reduce muscle soreness and to continue prescribed HEP. If patient was dry needled over the lung field, patient was instructed on signs and symptoms of pneumothorax and, however unlikely, to see immediate medical attention should they occur. Patient was also educated on signs and symptoms of infection and to seek medical attention should they occur. Patient verbalized understanding of these instructions and education.  ? ?  ?PATIENT EDUCATION: ?Education details: re-eval findings, HEP, POC  ?Person educated: Patient ?Education  method: Explanation, Demonstration, and Handout ?Education comprehension: verbalized understanding ?  ?  ?HOME EXERCISE PROGRAM: ?Access Code: [email or web address] ?URL: [email or web address] ?Date: 04/21/2021 ?Prepared by: Edwinna Areola ? ?Exercises ?Standing Shoulder Row with Anchored Resistance - 1 x daily - 7 x weekly - 3 sets - 15 reps - 3 sec hold ?Shoulder External Rotation and Scapular Retraction with Resistance - 1 x daily - 7 x weekly - 3 sets - 10 reps ?Shoulder External Rotation with Anchored Resistance - 1 x daily - 7 x weekly - 3 sets - 10 reps ? ?  ?ASSESSMENT: ?*** ? ?Problem List: ?decreased ROM, decreased strength, and pain   ?  ?GOALS: ?  ?LONG TERM GOALS:  ?  ?LTG Name Target Date Goal status  ?1 Pt will be Ind in a final HEP to maintain achieved LOF ?Baseline: 06/02/2021 ONGOING  ?2 Pt will report a decrease in L shoulder pain with daily activities to 4/10 or less ?Baseline:7-10/10 06/02/2021 ONGOING  ?3 Pt will demonstrated L shoulder strength of 4/5 for improved functional use of her L UE ?Baseline: grossly 3/5 ?04/21/2021: see chart 06/02/2021 ONGOING  ?4 Pt will demonstrate improved L shoulder ROM to flex-145d, ER- T2, and IR - T12 for improved functional use of her L UE ?Baseline: see cjart 06/02/2021 ONGOING  ?5 Set advanced goals for the L shoulder as indicated ?Baseline: 06/02/2021 ONGOING  ?  ?PLAN: ?PT FREQUENCY: 1x/week ?  ?PT DURATION: 6 weeks ?  ?PLANNED INTERVENTIONS: Therapeutic exercises, Therapeutic activity, Patient/Family education, Joint mobilization, Dry Needling, Electrical stimulation, Spinal mobilization, Cryotherapy, Moist heat, Taping, Traction, Ultrasound, Ionotophoresis 4mg /ml Dexamethasone, and Manual therapy ?  ?PLAN FOR NEXT SESSION: Assess response to HEP. Progress therex as as indicated. Use of manual care and modalities as indicated. ? ? ? , PT ?05/28/2021, 1:58 PM ? ?  ? ?

## 2021-05-29 ENCOUNTER — Ambulatory Visit: Payer: Medicaid Other

## 2021-05-29 DIAGNOSIS — M25612 Stiffness of left shoulder, not elsewhere classified: Secondary | ICD-10-CM

## 2021-05-29 DIAGNOSIS — G8929 Other chronic pain: Secondary | ICD-10-CM

## 2021-05-29 DIAGNOSIS — M25512 Pain in left shoulder: Secondary | ICD-10-CM | POA: Diagnosis not present

## 2021-05-29 DIAGNOSIS — M6281 Muscle weakness (generalized): Secondary | ICD-10-CM

## 2021-05-29 NOTE — Therapy (Signed)
?OUTPATIENT PHYSICAL THERAPY TREATMENT NOTE/ ReCert ? ? ?Patient Name: [name] ?MRN: [id number] ?DOB:1985/04/03, 36 y.o., female ?Today's Date: 05/29/2021 ? ?PCP: Nolene Ebbs, MD ?REFERRING PROVIDER: Nolene Ebbs, MD ? ? PT End of Session - 05/29/21 1154   ? ? Visit Number 5   ? Number of Visits 13   ? Date for PT Re-Evaluation 06/02/21   ? Authorization Type MCD - [employer]  Approved 10 PT visits 04/22/2021-06/21/2021  auth#:[id number]   ? Progress Note Due on Visit 10   ? PT Start Time 1150   ? PT Stop Time 1230   ? PT Time Calculation (min) 40 min   ? Activity Tolerance Patient limited by pain   ? Behavior During Therapy [facility] for tasks assessed/performed   ? ?  ?  ? ?  ? ? ? ? ?History reviewed. No pertinent past medical history. ?Past Surgical History:  ?Procedure Laterality Date  ? MANDIBLE FRACTURE SURGERY    ? ?Patient Active Problem List  ? Diagnosis Date Noted  ? Normal delivery 10/02/2013  ? Indication for care in labor or delivery 10/01/2013  ? ? ?REFERRING DIAG: chronic pain in left shoulder ? ?THERAPY DIAG:  ?Chronic left shoulder pain ? ?Muscle weakness (generalized) ? ?Stiffness of left shoulder, not elsewhere classified ? ?PERTINENT HISTORY: None ? ?PRECAUTIONS: None ? ?SUBJECTIVE:  ?Pt reports her L shoulder pain has improved to being more consistently 7/10 vs 10/10. ? ?Pain: ?Are you having pain? Yes ?NPRS: 7/10 ?Pain Location: L shoulder ?Pain Frequency/Description: intermittent  ?Aggravating Factors: working, sleeping, driving ?Relieving Factors: heating pad  ? ? ?OBJECTIVE:  ?  ?UPPER EXTREMITY AROM/PROM: ?  ?A/PROM Left ?03/06/2021 Left  ?04/21/2021 Left ?05/29/21  ?Shoulder flexion 135 140 140  ?Shoulder extension   120   ?Shoulder abduction      ?Shoulder adduction      ?Shoulder internal rotation L4 L4 L4  ?Shoulder external rotation C7  C7  ?(Blank rows = not tested) ?  ?UPPER EXTREMITY MMT: ?  ?MMT Left ?03/06/2021 Left ?04/21/2021 Left  ?Shoulder flexion 3 3/5 4/5 with report of pain   ?Shoulder extension 3    ?Shoulder abduction 3 3/5 4/5 with report of pain  ?Shoulder adduction 3    ?ER 3 3/5 (gave away) 4/5 with report of pain  ?IR 3 3/5 4/5 with report of pain  ?- Pt reported significant L shoulder pain with give way strength with light MMT resistance ?  ?INTERVENTIONS: ?[place] Adult PT Treatment:                                                DATE: 3/331/23 ?Therapeutic Exercise: ?UBE lvl 2.0 x 3 min (1.10fwd/1.5bwd) ?Seated row 2x10 25# ?Seated shoulder press 2x10 10# ?Serratus roll with soft foam 2x10 YTB ?Bilateral ER w/ scap retraction 1x15 RTB ?Seated horizontal abd 1x15 RTB ?MMT for L shoulder strength ? ?[facility] Adult PT Treatment:                                                DATE: 05/20/2021 ?Therapeutic Exercise: ?UBE lvl 2.0 x 3 min (1.20fwd/1.5bwd) ?Standing row 2x10 17# ?Horizontal abd cross 3# x 10 ?Serratus punch x 15 3# ?Serratus roll with  soft foam 2x10 YTB ?YTB wall walk horizontal x 10 - 80ft ?Seated row 2x10 25# ?Lat pulldown 2x10 25# ? ?[facility] Adult PT Treatment:                                                DATE: 04/28/2021 ?Therapeutic Exercise: ?UBE lvl 2.0 x 3 min (1.56fwd/1.5bwd) ?Seated row 2x10 25# ?Serratus roll with soft foam 2x10 YTB ?Bilateral ER w/ scap retraction x 15 RTB ?Seated horizontal abd x 15 RTB ?Manual Therapy: ?Skilled palpation of trigger point ?STM of L periscapular muscles ?Trigger Point Dry Needling Treatment: ?Pre-treatment instruction: Patient instructed on dry needling rationale, procedures, and possible side effects including pain during treatment (achy,cramping feeling), bruising, drop of blood, lightheadedness, nausea, sweating. ?Patient Consent Given: Yes ?Education handout provided: No ?Muscles treated: L rhomboids  ?Needle size and number:  30x21mm ?Electrical stimulation performed: No ?Parameters: N/A ?Treatment response/outcome: Twitch response elicited and Palpable decrease in muscle tension ?Post-treatment instructions: Patient instructed to  expect possible mild to moderate muscle soreness later today and/or tomorrow. Patient instructed in methods to reduce muscle soreness and to continue prescribed HEP. If patient was dry needled over the lung field, patient was instructed on signs and symptoms of pneumothorax and, however unlikely, to see immediate medical attention should they occur. Patient was also educated on signs and symptoms of infection and to seek medical attention should they occur. Patient verbalized understanding of these instructions and education.  ?  ?PATIENT EDUCATION: ?Education details: re-eval findings, HEP, POC  ?Person educated: Patient ?Education method: Explanation, Demonstration, and Handout ?Education comprehension: verbalized understanding ?  ?  ?HOME EXERCISE PROGRAM: ?Access Code: [email or web address] ?URL: [email or web address] ?Date: 04/21/2021 ?Prepared by: Octavio Manns ? ?Exercises ?Standing Shoulder Row with Anchored Resistance - 1 x daily - 7 x weekly - 3 sets - 15 reps - 3 sec hold ?Shoulder External Rotation and Scapular Retraction with Resistance - 1 x daily - 7 x weekly - 3 sets - 10 reps ?Shoulder External Rotation with Anchored Resistance - 1 x daily - 7 x weekly - 3 sets - 10 reps ? ?  ?ASSESSMENT: ?Pt has been able to participate in limited portion of her recommended/approved PT sessions. Reassessment of the pt's L shoulder was completed with pt demonstrating improved L shoulder strength per MMT. Pt reports her L shoulder pain is less than what it normal has usually been, 7/10 vs 10/10. Pt will benefit from skilled PT to address below impairments to optimize her L shoulder function with less pain. ? ?Problem List: ?decreased ROM, decreased strength, and pain   ?  ?GOALS: ?  ?LONG TERM GOALS:  ?  ?LTG Name Target Date Goal status  ?1 Pt will be Ind in a final HEP to maintain achieved LOF. 3/31//23-Met for Ind in an initial HEP ?Baseline: 06/02/2021 ONGOING ?Met for initial HEP  ?2 Pt will report a decrease in L  shoulder pain with daily activities to 4/10 or less. 05/29/21- 7/10 ?Baseline:7-10/10 06/02/2021 ONGOING- ?Improving  ?3 Pt will demonstrated L shoulder strength of 4/5 for improved functional use of her L UE. 05/29/21-see flow sheet ?Baseline: grossly 3/5 ?04/21/2021: see chart 06/02/2021 Met  ?4 Pt will demonstrate improved L shoulder ROM to flex-145d, ER- T2, and IR - T12 for improved functional use of her L UE. ?05/29/21-See flow sheet min improvement ?Baseline: see cjart 06/02/2021 ONGOING- ?  Min improvement  ?5 Set advanced goals for the L shoulder as indicated ?Baseline: 06/02/2021 ONGOING  ?  ?PLAN: ?PT FREQUENCY: 1x/week ?  ?PT DURATION: 5 weeks ?  ?PLANNED INTERVENTIONS: Therapeutic exercises, Therapeutic activity, Patient/Family education, Joint mobilization, Dry Needling, Electrical stimulation, Spinal mobilization, Cryotherapy, Moist heat, Taping, Traction, Ultrasound, Ionotophoresis 4mg /ml Dexamethasone, and Manual therapy ?  ?PLAN FOR NEXT SESSION: Assess response to HEP. Progress therex as as indicated. Use of manual care and modalities as indicated. ? ? ?Gar Ponto MS, PT ?05/29/21 6:51 PM ? ? ?  ? ?

## 2021-06-01 NOTE — Therapy (Deleted)
?OUTPATIENT PHYSICAL THERAPY TREATMENT NOTE/ ReCert ? ? ?Patient Name: [name] ?MRN: [id number] ?DOB:[birth year], 36 y.o., female ?Today's Date: 06/01/2021 ? ?PCP: Nolene Ebbs, MD ?REFERRING PROVIDER: Nolene Ebbs, MD ? ? ? ? ? ? ?No past medical history on file. ?Past Surgical History:  ?Procedure Laterality Date  ? MANDIBLE FRACTURE SURGERY    ? ?Patient Active Problem List  ? Diagnosis Date Noted  ? Normal delivery 10/02/2013  ? Indication for care in labor or delivery 10/01/2013  ? ? ?REFERRING DIAG: chronic pain in left shoulder ? ?THERAPY DIAG:  ?No diagnosis found. ? ?PERTINENT HISTORY: None ? ?PRECAUTIONS: None ? ?SUBJECTIVE:  ?Pt reports her L shoulder pain has improved to being more consistently 7/10 vs 10/10. ? ?Pain: ?Are you having pain? Yes ?NPRS: 7/10 ?Pain Location: L shoulder ?Pain Frequency/Description: intermittent  ?Aggravating Factors: working, sleeping, driving ?Relieving Factors: heating pad  ? ? ?OBJECTIVE:  ?  ?UPPER EXTREMITY AROM/PROM: ?  ?A/PROM Left ?03/06/2021 Left  ?04/21/2021 Left ?05/29/21  ?Shoulder flexion 135 140 140  ?Shoulder extension   120   ?Shoulder abduction      ?Shoulder adduction      ?Shoulder internal rotation L4 L4 L4  ?Shoulder external rotation C7  C7  ?(Blank rows = not tested) ?  ?UPPER EXTREMITY MMT: ?  ?MMT Left ?03/06/2021 Left ?04/21/2021 Left  ?Shoulder flexion 3 3/5 4/5 with report of pain  ?Shoulder extension 3    ?Shoulder abduction 3 3/5 4/5 with report of pain  ?Shoulder adduction 3    ?ER 3 3/5 (gave away) 4/5 with report of pain  ?IR 3 3/5 4/5 with report of pain  ?- Pt reported significant L shoulder pain with give way strength with light MMT resistance ?  ?INTERVENTIONS: ?[place] Adult PT Treatment:                                                DATE: 3/331/23 ?Therapeutic Exercise: ?UBE lvl 2.0 x 3 min (1.31fd/1.5bwd) ?Seated row 2x10 25# ?Seated shoulder press 2x10 10# ?Serratus roll with soft foam 2x10 YTB ?Bilateral ER w/ scap retraction 1x15  RTB ?Seated horizontal abd 1x15 RTB ?MMT for L shoulder strength ? [facility]                                                [facility]: [facility] ?Therapeutic Exercise: ?UBE lvl 2.0 x 3 min (1.558f/1.5bwd) ?Standing row 2x10 17# ?Horizontal abd cross 3# x 10 ?Serratus punch x 15 3# ?Serratus roll with soft foam 2x10 YTB ?YTB wall walk horizontal x 10 - 67f7fSeated row 2x10 25# ?Lat pulldown 2x10 25# ? ?[facility]                                                [facility]: [facility] ?Therapeutic Exercise: ?UBE lvl 2.0 x 3 min (1.67fw567f.5bwd) ?Seated row 2x10 25# ?Serratus roll with soft foam 2x10 YTB ?Bilateral ER w/ scap retraction x 15 RTB ?Seated horizontal abd x 15 RTB ?Manual Therapy: ?Skilled palpation of trigger point ?STM of L periscapular muscles ?  Trigger Point Dry Needling Treatment: ?Pre-treatment instruction: Patient instructed on dry needling rationale, procedures, and possible side effects including pain during treatment (achy,cramping feeling), bruising, drop of blood, lightheadedness, nausea, sweating. ?Patient Consent Given: Yes ?Education handout provided: No ?Muscles treated: L rhomboids  ?Needle size and number:  30x25m ?Electrical stimulation performed: No ?Parameters: N/A ?Treatment response/outcome: Twitch response elicited and Palpable decrease in muscle tension ?Post-treatment instructions: Patient instructed to expect possible mild to moderate muscle soreness later today and/or tomorrow. Patient instructed in methods to reduce muscle soreness and to continue prescribed HEP. If patient was dry needled over the lung field, patient was instructed on signs and symptoms of pneumothorax and, however unlikely, to see immediate medical attention should they occur. Patient was also educated on signs and symptoms of infection and to seek medical attention should they occur. Patient verbalized understanding of these instructions and education.  ?  ?PATIENT EDUCATION: ?Education  details: re-eval findings, HEP, POC  ?Person educated: Patient ?Education method: Explanation, Demonstration, and Handout ?Education comprehension: verbalized understanding ?  ?  ?HOME EXERCISE PROGRAM: ?Access Code: [email or web address]?URL: [email or web address] ?Date: 04/21/2021 ?Prepared by: DOctavio Manns? ?Exercises ?Standing Shoulder Row with Anchored Resistance - 1 x daily - 7 x weekly - 3 sets - 15 reps - 3 sec hold ?Shoulder External Rotation and Scapular Retraction with Resistance - 1 x daily - 7 x weekly - 3 sets - 10 reps ?Shoulder External Rotation with Anchored Resistance - 1 x daily - 7 x weekly - 3 sets - 10 reps ? ?  ?ASSESSMENT: ?Pt has been able to participate in limited portion of her recommended/approved PT sessions. Reassessment of the pt's L shoulder was completed with pt demonstrating improved L shoulder strength per MMT. Pt reports her L shoulder pain is less than what it normal has usually been, 7/10 vs 10/10. Pt will benefit from skilled PT to address below impairments to optimize her L shoulder function with less pain. ? ?Problem List: ?decreased ROM, decreased strength, and pain   ?  ?GOALS: ?  ?LONG TERM GOALS:  ?  ?LTG Name Target Date Goal status  ?1 Pt will be Ind in a final HEP to maintain achieved LOF. 3/31//23-Met for Ind in an initial HEP ?Baseline: 06/02/2021 ONGOING ?Met for initial HEP  ?2 Pt will report a decrease in L shoulder pain with daily activities to 4/10 or less. 05/29/21- 7/10 ?Baseline:7-10/10 06/02/2021 ONGOING- ?Improving  ?3 Pt will demonstrated L shoulder strength of 4/5 for improved functional use of her L UE. 05/29/21-see flow sheet ?Baseline: grossly 3/5 ?04/21/2021: see chart 06/02/2021 Met  ?4 Pt will demonstrate improved L shoulder ROM to flex-145d, ER- T2, and IR - T12 for improved functional use of her L UE. ?05/29/21-See flow sheet min improvement ?Baseline: see cjart 06/02/2021 ONGOING- ?Min improvement  ?5 Set advanced goals for the L shoulder as  indicated ?Baseline: 06/02/2021 ONGOING  ?  ?PLAN: ?PT FREQUENCY: 1x/week ?  ?PT DURATION: 5 weeks ?  ?PLANNED INTERVENTIONS: Therapeutic exercises, Therapeutic activity, Patient/Family education, Joint mobilization, Dry Needling, Electrical stimulation, Spinal mobilization, Cryotherapy, Moist heat, Taping, Traction, Ultrasound, Ionotophoresis 464mml Dexamethasone, and Manual therapy ?  ?PLAN FOR NEXT SESSION: Assess response to HEP. Progress therex as as indicated. Use of manual care and modalities as indicated. ? ? ?AlGar PontoS, PT ?06/01/21 11:36 AM ? ? ?  ? ?

## 2021-06-16 ENCOUNTER — Ambulatory Visit: Payer: Medicaid Other

## 2021-06-24 ENCOUNTER — Ambulatory Visit: Payer: Medicaid Other | Attending: Internal Medicine

## 2021-06-24 DIAGNOSIS — M6281 Muscle weakness (generalized): Secondary | ICD-10-CM | POA: Diagnosis present

## 2021-06-24 DIAGNOSIS — M25512 Pain in left shoulder: Secondary | ICD-10-CM | POA: Insufficient documentation

## 2021-06-24 DIAGNOSIS — G8929 Other chronic pain: Secondary | ICD-10-CM | POA: Diagnosis present

## 2021-06-24 NOTE — Therapy (Addendum)
OUTPATIENT PHYSICAL THERAPY TREATMENT NOTE/RE-CERTIFICATION/DISCHARGE  PHYSICAL THERAPY DISCHARGE SUMMARY  Visits from Start of Care: 6  Current functional level related to goals / functional outcomes: See goals and objective   Remaining deficits: See goals and objective   Education / Equipment: HE[   Patient agrees to discharge. Patient goals were  somewhat met . Patient is being discharged due to not returning since the last visit.   Patient Name: [name] MRN: [id number] DOB:[birth year], 36 y.o., female Today's Date: 06/25/2021  PCP: Nolene Ebbs, MD REFERRING PROVIDER: Nolene Ebbs, MD   PT End of Session - 06/24/21 1836     Visit Number 6    Number of Visits 13    Date for PT Re-Evaluation 08/06/21    Authorization Type MCD - [employer]  Approved 10 PT visits 04/22/2021-06/21/2021  auth#:[id number]    Progress Note Due on Visit 10    PT Start Time 3893    PT Stop Time 1911    PT Time Calculation (min) 38 min    Activity Tolerance Patient tolerated treatment well    Behavior During Therapy [facility] for tasks assessed/performed               History reviewed. No pertinent past medical history. Past Surgical History:  Procedure Laterality Date   MANDIBLE FRACTURE SURGERY     Patient Active Problem List   Diagnosis Date Noted   Normal delivery 10/02/2013   Indication for care in labor or delivery 10/01/2013    REFERRING DIAG: chronic pain in left shoulder  THERAPY DIAG:  Chronic left shoulder pain - Plan: PT plan of care cert/re-cert  Muscle weakness (generalized) - Plan: PT plan of care cert/re-cert  PERTINENT HISTORY: None  PRECAUTIONS: None  SUBJECTIVE:  Pt presents to PT with reports of continued L shoulder pain. She has been compliant with her home exercises and notes that pain does decrease  with program. She was out of the [place] the last few weeks but would like to resume PT. Pt is ready to begin PT at this time.  Pain: Are you  having pain? Yes NPRS: 7/10 Pain Location: L shoulder Pain Frequency/Description: intermittent  Aggravating Factors: working, sleeping, driving Relieving Factors: heating pad    OBJECTIVE:    UPPER EXTREMITY AROM/PROM:   A/PROM Left 03/06/2021 Left  04/21/2021 Left 05/29/21 Left 06/24/2021  Shoulder flexion 135 140 140 145  Shoulder extension   120    Shoulder abduction       Shoulder adduction       Shoulder internal rotation L4 L4 L4 L4  Shoulder external rotation C7  C7 C7  (Blank rows = not tested)   UPPER EXTREMITY MMT:   MMT Left 03/06/2021 Left 04/21/2021 Left 05/29/2021 Left 06/25/2021  Shoulder flexion 3 3/5 4/5 with report of pain 4/5 with report of pain  Shoulder extension 3     Shoulder abduction 3 3/5 4/5 with report of pain 4/5 with report of pain  Shoulder adduction 3     ER 3 3/5 (gave away) 4/5 with report of pain 4/5 with report of pain  IR 3 3/5 4/5 with report of pain 4/5 with report of pain  - Pt reported significant L shoulder pain with give way strength with light MMT resistance   INTERVENTIONS: [facility] Adult PT Treatment:  DATE: 06/24/2021 Therapeutic Exercise: UBE lvl 2.0 x 3 min (1.76fwd/1.5bwd) Row 3x10 17# Cross horizontal abd 2x10 3# Single arm row with ER 3x10 RTB L Serratus roll with soft foam 2x10 YTB Bilateral ER w/ scap retraction 3x10 GTB Seated horizontal abd 2x15 RTB  [facility] Adult PT Treatment:                                                DATE: 05/29/21 Therapeutic Exercise: UBE lvl 2.0 x 3 min (1.53fwd/1.5bwd) Seated row 2x10 25# Seated shoulder press 2x10 10# Serratus roll with soft foam 2x10 YTB Bilateral ER w/ scap retraction 1x15 RTB Seated horizontal abd 1x15 RTB MMT for L shoulder strength  [facility] Adult PT Treatment:                                                DATE: 05/20/2021 Therapeutic Exercise: UBE lvl 2.0 x 3 min (1.107fwd/1.5bwd) Standing row 2x10 17# Horizontal abd cross 3# x  10 Serratus punch x 15 3# Serratus roll with soft foam 2x10 YTB YTB wall walk horizontal x 10 - 59ft Seated row 2x10 25# Lat pulldown 2x10 25#  PATIENT EDUCATION: Education details: re-eval findings, HEP, POC  Person educated: Patient Education method: Explanation, Demonstration, and Handout Education comprehension: verbalized understanding     HOME EXERCISE PROGRAM: Access Code: [email or web address] URL: [email or web address] Date: 04/21/2021 Prepared by: Octavio Manns  Exercises Standing Shoulder Row with Anchored Resistance - 1 x daily - 7 x weekly - 3 sets - 15 reps - 3 sec hold Shoulder External Rotation and Scapular Retraction with Resistance - 1 x daily - 7 x weekly - 3 sets - 10 reps Shoulder External Rotation with Anchored Resistance - 1 x daily - 7 x weekly - 3 sets - 10 reps    ASSESSMENT: Pt was able to complete prescribed exercises with improved tolerance and ROM. She continues to demonstrate improving ROM and strength for L shoulder, but continues to be limited by severe pain, especially with overhead movements/lifting. Pt would continue to benefit from skilled PT 1x/wk with continued HEP updates for decreasing shoulder pain and improving functional ability. PT will continue to focus on periscapular and rotator cuff muscle strengthening for continued dynamic stabilization of L UE.   Problem List: decreased ROM, decreased strength, and pain     GOALS:   LONG TERM GOALS:    LTG Name Target Date Goal status  1 Pt will be Ind in a final HEP to maintain achieved LOF. 3/31//23-Met for Ind in an initial HEP Baseline: 06/02/2021 ONGOING Met for initial HEP  2 Pt will report a decrease in L shoulder pain with daily activities to 4/10 or less.  Baseline:7-10/10 05/29/2021- 7/10 06/24/2021: 7/10 08/06/2021 ONGOING- Improving  3 Pt will demonstrated L shoulder strength of 4/5 for improved functional use of her L UE. 05/29/21-see flow sheet Baseline: grossly 3/5 04/21/2021:  see chart 06/02/2021 Met  4 Pt will demonstrate improved L shoulder ROM to flex-145d, ER- T2, and IR - T12 for improved functional use of her L UE. Baseline: see chart 05/29/21-See flow sheet min improvement 06/24/2021: see chart 08/06/2021 ONGOING- Min improvement  5 Pt will be able to lift 15# KB overhead with no increase  in shoulder pain for improved functional ability with home ADLs Baseline: unable 08/06/2021 ONGOING    PLAN: PT FREQUENCY: 1x/week   PT DURATION: 6 weeks   PLANNED INTERVENTIONS: Therapeutic exercises, Therapeutic activity, Patient/Family education, Joint mobilization, Dry Needling, Electrical stimulation, Spinal mobilization, Cryotherapy, Moist heat, Taping, Traction, Ultrasound, Ionotophoresis 4mg /ml Dexamethasone, and Manual therapy   PLAN FOR NEXT SESSION: Assess response to HEP. Progress therex as as indicated. Use of manual care and modalities as indicated.   Check all possible CPT codes: 25486 - Re-evaluation, 97110- Therapeutic Exercise, [phone]- Neuro Re-education, [phone] - Gait Training, [phone] - Manual Therapy, 97530 - Therapeutic Activities, and 97535 - Self Care     If treatment provided at initial evaluation, no treatment charged due to lack of authorization.       Ward Chatters PT 06/25/21 10:33 AM

## 2021-06-30 ENCOUNTER — Ambulatory Visit: Payer: Medicaid Other

## 2021-07-08 ENCOUNTER — Ambulatory Visit: Payer: Medicaid Other | Attending: Internal Medicine

## 2021-07-08 NOTE — Therapy (Incomplete)
?OUTPATIENT PHYSICAL THERAPY TREATMENT NOTE/RE-CERTIFICATION ? ? ?Patient Name: [name] ?MRN: [id number] ?DOB:May 19, 1985, 36 y.o., female ?Today's Date: 07/08/2021 ? ?PCP: Nolene Ebbs, MD ?REFERRING PROVIDER: Nolene Ebbs, MD ? ? ? ? ? ? ?No past medical history on file. ?Past Surgical History:  ?Procedure Laterality Date  ? MANDIBLE FRACTURE SURGERY    ? ?Patient Active Problem List  ? Diagnosis Date Noted  ? Normal delivery 10/02/2013  ? Indication for care in labor or delivery 10/01/2013  ? ? ?REFERRING DIAG: chronic pain in left shoulder ? ?THERAPY DIAG:  ?No diagnosis found. ? ?PERTINENT HISTORY: None ? ?PRECAUTIONS: None ? ?SUBJECTIVE:  ?Pt presents to PT with reports of continued L shoulder pain. She has been compliant with her home exercises and notes that pain does decrease  with program. She was out of the [place] the last few weeks but would like to resume PT. Pt is ready to begin PT at this time. ? ?Pain: ?Are you having pain? Yes ?NPRS: 7/10 ?Pain Location: L shoulder ?Pain Frequency/Description: intermittent  ?Aggravating Factors: working, sleeping, driving ?Relieving Factors: heating pad  ? ? ?OBJECTIVE:  ?  ?UPPER EXTREMITY AROM/PROM: ?  ?A/PROM Left ?03/06/2021 Left  ?04/21/2021 Left ?05/29/21 Left ?06/24/2021  ?Shoulder flexion 135 140 140 145  ?Shoulder extension   120    ?Shoulder abduction       ?Shoulder adduction       ?Shoulder internal rotation L4 L4 L4 L4  ?Shoulder external rotation C7  C7 C7  ?(Blank rows = not tested) ?  ?UPPER EXTREMITY MMT: ?  ?MMT Left ?03/06/2021 Left ?04/21/2021 Left ?05/29/2021 Left ?06/25/2021  ?Shoulder flexion 3 3/5 4/5 with report of pain 4/5 with report of pain  ?Shoulder extension 3     ?Shoulder abduction 3 3/5 4/5 with report of pain 4/5 with report of pain  ?Shoulder adduction 3     ?ER 3 3/5 (gave away) 4/5 with report of pain 4/5 with report of pain  ?IR 3 3/5 4/5 with report of pain 4/5 with report of pain  ?- Pt reported significant L shoulder pain with  give way strength with light MMT resistance ?  ?INTERVENTIONS: ?[facility] Adult PT Treatment:                                                DATE: 07/08/2021 ?Therapeutic Exercise: ?UBE lvl 2.0 x 3 min (1.55fwd/1.5bwd) ?Row 3x10 17# ?Cross horizontal abd 2x10 3# ?Single arm row with ER 3x10 RTB L ?Serratus roll with soft foam 2x10 YTB ?Bilateral ER w/ scap retraction 3x10 GTB ?Seated horizontal abd 2x15 RTB ? ?[facility] Adult PT Treatment:                                                DATE: 06/24/2021 ?Therapeutic Exercise: ?UBE lvl 2.0 x 3 min (1.83fwd/1.5bwd) ?Row 3x10 17# ?Cross horizontal abd 2x10 3# ?Single arm row with ER 3x10 RTB L ?Serratus roll with soft foam 2x10 YTB ?Bilateral ER w/ scap retraction 3x10 GTB ?Seated horizontal abd 2x15 RTB ? ?[facility] Adult PT Treatment:  DATE: 05/29/2021 ?Therapeutic Exercise: ?UBE lvl 2.0 x 3 min (1.84fwd/1.5bwd) ?Seated row 2x10 25# ?Seated shoulder press 2x10 10# ?Serratus roll with soft foam 2x10 YTB ?Bilateral ER w/ scap retraction 1x15 RTB ?Seated horizontal abd 1x15 RTB ?MMT for L shoulder strength ? ?PATIENT EDUCATION: ?Education details: re-eval findings, HEP, POC  ?Person educated: Patient ?Education method: Explanation, Demonstration, and Handout ?Education comprehension: verbalized understanding ?  ?  ?HOME EXERCISE PROGRAM: ?Access Code: [email or web address] ?URL: [email or web address] ?Date: 04/21/2021 ?Prepared by: Octavio Manns ? ?Exercises ?Standing Shoulder Row with Anchored Resistance - 1 x daily - 7 x weekly - 3 sets - 15 reps - 3 sec hold ?Shoulder External Rotation and Scapular Retraction with Resistance - 1 x daily - 7 x weekly - 3 sets - 10 reps ?Shoulder External Rotation with Anchored Resistance - 1 x daily - 7 x weekly - 3 sets - 10 reps ? ?  ?ASSESSMENT: ?*** ? ?Problem List: ?decreased ROM, decreased strength, and pain   ?  ?GOALS: ?  ?LONG TERM GOALS:  ?  ?LTG Name Target Date Goal status  ?1 Pt will be Ind in a  final HEP to maintain achieved LOF. 3/31//23-Met for Ind in an initial HEP ?Baseline: 06/02/2021 ONGOING ?Met for initial HEP  ?2 Pt will report a decrease in L shoulder pain with daily activities to 4/10 or less.  ?Baseline:7-10/10 ?05/29/2021- 7/10 ?06/24/2021: 7/10 08/06/2021 ONGOING- ?Improving  ?3 Pt will demonstrated L shoulder strength of 4/5 for improved functional use of her L UE. 05/29/21-see flow sheet ?Baseline: grossly 3/5 ?04/21/2021: see chart 06/02/2021 Met  ?4 Pt will demonstrate improved L shoulder ROM to flex-145d, ER- T2, and IR - T12 for improved functional use of her L UE. ?Baseline: see chart ?05/29/21-See flow sheet min improvement ?06/24/2021: see chart 08/06/2021 ONGOING- ?Min improvement  ?5 Pt will be able to lift 15# KB overhead with no increase in shoulder pain for improved functional ability with home ADLs ?Baseline: unable 08/06/2021 ONGOING  ?  ?PLAN: ?PT FREQUENCY: 1x/week ?  ?PT DURATION: 6 weeks ?  ?PLANNED INTERVENTIONS: Therapeutic exercises, Therapeutic activity, Patient/Family education, Joint mobilization, Dry Needling, Electrical stimulation, Spinal mobilization, Cryotherapy, Moist heat, Taping, Traction, Ultrasound, Ionotophoresis 4mg /ml Dexamethasone, and Manual therapy ?  ?PLAN FOR NEXT SESSION: Assess response to HEP. Progress therex as as indicated. Use of manual care and modalities as indicated. ? ? ?Check all possible CPT codes: 83818 - Re-evaluation, 97110- Therapeutic Exercise, [phone]- Neuro Re-education, [phone] - Gait Training, [phone] - Manual Therapy, 97530 - Therapeutic Activities, and 97535 - Self Care    ? ?If treatment provided at initial evaluation, no treatment charged due to lack of authorization.    ?  ? ?Ward Chatters PT ?07/08/21 5:43 PM ? ? ?  ? ?

## 2021-07-23 ENCOUNTER — Other Ambulatory Visit: Payer: Self-pay | Admitting: Internal Medicine

## 2021-07-23 DIAGNOSIS — N6459 Other signs and symptoms in breast: Secondary | ICD-10-CM

## 2021-08-10 ENCOUNTER — Ambulatory Visit: Payer: Medicaid Other | Attending: Internal Medicine | Admitting: Physical Therapy

## 2021-08-10 NOTE — Therapy (Incomplete)
OUTPATIENT PHYSICAL THERAPY SHOULDER EVALUATION   Patient Name: [name] MRN: [id number] DOB:1985/10/12, 36 y.o., female Today's Date: 08/10/2021    No past medical history on file. Past Surgical History:  Procedure Laterality Date   MANDIBLE FRACTURE SURGERY     Patient Active Problem List   Diagnosis Date Noted   Normal delivery 10/02/2013   Indication for care in labor or delivery 10/01/2013    PCP: Fleet Contras, MD  REFERRING PROVIDER: Fleet Contras, MD  REFERRING DIAG: CHRONIC PAIN IN LEFT SHOULDER  THERAPY DIAG:  No diagnosis found.  Rationale for Evaluation and Treatment Rehabilitation  ONSET DATE: ***   SUBJECTIVE:       SUBJECTIVE STATEMENT: ***  PERTINENT HISTORY: None  PAIN:  Are you having pain? Yes: NPRS scale: ***/10 Pain location: *** Pain description: *** Aggravating factors: *** Relieving factors: ***  PRECAUTIONS: None  WEIGHT BEARING RESTRICTIONS No  FALLS:  Has patient fallen in last 6 months? 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  PLOF: Independent  PATIENT GOALS: Pain relief   OBJECTIVE:  DIAGNOSTIC FINDINGS:  ***  PATIENT SURVEYS:  Quick Dash ***  COGNITION: Overall cognitive status: Within functional limits for tasks assessed     SENSATION: WFL  POSTURE: ***  UPPER EXTREMITY ROM:   {AROM/PROM:27142} ROM Right eval Left eval  Shoulder flexion    Shoulder extension    Shoulder abduction    Shoulder internal rotation    Shoulder external rotation    (Blank rows = not tested)  UPPER EXTREMITY MMT:  MMT Right eval Left eval  Shoulder flexion    Shoulder extension    Shoulder abduction    Shoulder internal rotation    Shoulder external rotation    Middle trapezius    Lower trapezius    (Blank rows = not tested)  SHOULDER SPECIAL TESTS:  Impingement tests: {shoulder  impingement test:25231:a}  SLAP lesions: {SLAP lesions:25232}  Instability tests: {shoulder instability test:25233}  Rotator cuff assessment: {rotator cuff assessment:25234}  Biceps assessment: {biceps assessment:25235}  JOINT MOBILITY TESTING:  ***  PALPATION:  ***   TODAY'S TREATMENT:  ***   PATIENT EDUCATION: Education details: Exam findings, POC, HEP Person educated: Patient Education method: Explanation, Demonstration, Tactile cues, Verbal cues, and Handouts Education comprehension: verbalized understanding, returned demonstration, verbal cues required, tactile cues required, and needs further education  HOME EXERCISE PROGRAM: ***   ASSESSMENT: CLINICAL IMPRESSION: Patient is a 36 y.o. female who was seen today for physical therapy evaluation and treatment for chronic left shoulder pain consistent with rotator cuff related pain.    OBJECTIVE IMPAIRMENTS {opptimpairments:25111}.   ACTIVITY LIMITATIONS {activitylimitations:27494}  PARTICIPATION LIMITATIONS: {participationrestrictions:25113}  PERSONAL FACTORS {Personal factors:25162} are also affecting patient's functional outcome.   REHAB POTENTIAL: {rehabpotential:25112}  CLINICAL DECISION MAKING: {clinical decision making:25114}  EVALUATION COMPLEXITY: {Evaluation complexity:25115}   GOALS: Goals reviewed with patient? Yes  SHORT TERM GOALS: Target date: {follow up:25551}  (Remove Blue Hyperlink)  Patient will be I with initial HEP in order to progress with therapy. Baseline: HEP provided at eval Goal status: INITIAL  2.  Pt will report a decrease in left shoulder pain with daily activities to 4/10 or less to reduce functional limitations.  Baseline: ***/10 pain level with activity Goal status: INITIAL  3.  Pt will demonstrate an improvement in left shoulder AROM flexion ***>/= deg in order to improve overhead reach and ability  to perform self care tasks.  Baseline: *** deg Goal status:  INITIAL  LONG TERM GOALS: Target date: {follow up:25551}  (Remove Blue Hyperlink)  Patient will be I with final HEP to maintain progress from PT. Baseline: HEP provided at eval Goal status: INITIAL  2.  Patient will report Quick DASH </= % disability in order to indicate improvement in her functional level. Baseline: % disability Goal status: INITIAL  3.  *** Baseline:  Goal status: INITIAL  4.  *** Baseline:  Goal status: INITIAL   PLAN: PT FREQUENCY: 1x/week  PT DURATION: {rehab duration:25117}  PLANNED INTERVENTIONS: {rehab planned interventions:25118::"Therapeutic exercises","Therapeutic activity","Neuromuscular re-education","Balance training","Gait training","Patient/Family education","Joint mobilization"}  PLAN FOR NEXT SESSION: ***   Rosana Hoes, PT, DPT, LAT, ATC 08/10/21  3:21 PM Phone: [phone] Fax: [phone]

## 2021-08-14 ENCOUNTER — Other Ambulatory Visit: Payer: Self-pay | Admitting: Internal Medicine

## 2021-08-14 DIAGNOSIS — N644 Mastodynia: Secondary | ICD-10-CM

## 2021-08-19 ENCOUNTER — Ambulatory Visit: Payer: Medicaid Other | Admitting: Physical Therapy
